# Patient Record
Sex: Female | Born: 1940 | Race: Black or African American | Hispanic: No | Smoking: Former smoker
Health system: Southern US, Community
[De-identification: ages and names within clinical notes are randomized; demographics above are authoritative.]

## PROBLEM LIST (undated history)

## (undated) DIAGNOSIS — K5909 Other constipation: Secondary | ICD-10-CM

## (undated) DIAGNOSIS — G629 Polyneuropathy, unspecified: Secondary | ICD-10-CM

## (undated) DIAGNOSIS — N760 Acute vaginitis: Secondary | ICD-10-CM

## (undated) DIAGNOSIS — Z9221 Personal history of antineoplastic chemotherapy: Secondary | ICD-10-CM

## (undated) DIAGNOSIS — C50919 Malignant neoplasm of unspecified site of unspecified female breast: Secondary | ICD-10-CM

## (undated) DIAGNOSIS — K635 Polyp of colon: Secondary | ICD-10-CM

## (undated) DIAGNOSIS — I10 Essential (primary) hypertension: Secondary | ICD-10-CM

## (undated) DIAGNOSIS — M810 Age-related osteoporosis without current pathological fracture: Secondary | ICD-10-CM

## (undated) DIAGNOSIS — G4700 Insomnia, unspecified: Secondary | ICD-10-CM

## (undated) DIAGNOSIS — S329XXA Fracture of unspecified parts of lumbosacral spine and pelvis, initial encounter for closed fracture: Secondary | ICD-10-CM

## (undated) DIAGNOSIS — E785 Hyperlipidemia, unspecified: Secondary | ICD-10-CM

## (undated) DIAGNOSIS — K760 Fatty (change of) liver, not elsewhere classified: Secondary | ICD-10-CM

## (undated) HISTORY — DX: Fracture of unspecified parts of lumbosacral spine and pelvis, initial encounter for closed fracture: S32.9XXA

## (undated) HISTORY — DX: Age-related osteoporosis without current pathological fracture: M81.0

## (undated) HISTORY — DX: Other constipation: K59.09

## (undated) HISTORY — PX: BREAST REDUCTION SURGERY: SHX8

## (undated) HISTORY — DX: Essential (primary) hypertension: I10

## (undated) HISTORY — DX: Insomnia, unspecified: G47.00

## (undated) HISTORY — DX: Hyperlipidemia, unspecified: E78.5

## (undated) HISTORY — DX: Acute vaginitis: N76.0

## (undated) HISTORY — DX: Personal history of antineoplastic chemotherapy: Z92.21

## (undated) HISTORY — DX: Malignant neoplasm of unspecified site of unspecified female breast: C50.919

## (undated) HISTORY — PX: OTHER SURGICAL HISTORY: SHX169

## (undated) HISTORY — DX: Fatty (change of) liver, not elsewhere classified: K76.0

## (undated) HISTORY — DX: Polyneuropathy, unspecified: G62.9

## (undated) HISTORY — DX: Polyp of colon: K63.5

---

## 1990-06-26 DIAGNOSIS — C50919 Malignant neoplasm of unspecified site of unspecified female breast: Secondary | ICD-10-CM

## 1990-06-26 HISTORY — DX: Malignant neoplasm of unspecified site of unspecified female breast: C50.919

## 1990-06-26 HISTORY — PX: MASTECTOMY: SHX3

## 1999-06-27 DIAGNOSIS — Z9221 Personal history of antineoplastic chemotherapy: Secondary | ICD-10-CM

## 1999-06-27 HISTORY — DX: Personal history of antineoplastic chemotherapy: Z92.21

## 1999-06-27 HISTORY — PX: MASTECTOMY: SHX3

## 2001-06-26 HISTORY — PX: BREAST RECONSTRUCTION: SHX9

## 2005-06-26 HISTORY — PX: BREAST SURGERY: SHX581

## 2010-06-26 DIAGNOSIS — K635 Polyp of colon: Secondary | ICD-10-CM

## 2010-06-26 DIAGNOSIS — M810 Age-related osteoporosis without current pathological fracture: Secondary | ICD-10-CM

## 2010-06-26 HISTORY — DX: Polyp of colon: K63.5

## 2010-06-26 HISTORY — DX: Age-related osteoporosis without current pathological fracture: M81.0

## 2010-06-26 HISTORY — PX: COLONOSCOPY: SHX174

## 2013-07-27 DIAGNOSIS — K5909 Other constipation: Secondary | ICD-10-CM

## 2013-07-27 HISTORY — PX: UPPER GI ENDOSCOPY: SHX6162

## 2013-07-27 HISTORY — DX: Other constipation: K59.09

## 2014-06-22 ENCOUNTER — Other Ambulatory Visit: Payer: Self-pay | Admitting: *Deleted

## 2014-06-22 ENCOUNTER — Ambulatory Visit (INDEPENDENT_AMBULATORY_CARE_PROVIDER_SITE_OTHER): Payer: Medicare Other | Admitting: Podiatry

## 2014-06-22 ENCOUNTER — Encounter: Payer: Self-pay | Admitting: Podiatry

## 2014-06-22 VITALS — BP 149/97 | HR 70 | Resp 16 | Ht 68.0 in | Wt 214.0 lb

## 2014-06-22 DIAGNOSIS — B351 Tinea unguium: Secondary | ICD-10-CM

## 2014-06-22 NOTE — Progress Notes (Signed)
   Subjective:    Patient ID: Alexis Arias, female    DOB: [birth year], 73 y.o.   MRN: [id number]  HPI Comments: Thick toenails, cant cut self due to neuropathy in my feet. i am pre diabetic , do not take any medications for it      Review of Systems  All other systems reviewed and are negative.      Objective:   Physical Exam: I have reviewed her past mental history medications allergy surgery social history and review of systems. Pulses are strongly palpable bilateral. Neurologic sensorium is intact with slight decreased to the toes per Semmes-Weinstein monofilament. Deep tendon reflexes are intact bilateral muscle strength is 5 over 5 dorsiflexion plantar flexors and inverters everters all intrinsic musculature is intact. Orthopedic evaluation to stretch joints distal to the ankle for range of motion without crepitation. 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 Cutaneous evaluation demonstrates supple well-hydrated cutis nails are thick yellow dystrophic onychomycotic and sharply incurvated.        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  Plan: Debridement of nails 1 through 5 bilateral. Follow up with her in 3 months

## 2014-06-26 HISTORY — PX: DILATION AND CURETTAGE OF UTERUS: SHX78

## 2014-08-24 ENCOUNTER — Ambulatory Visit: Payer: Medicare Other

## 2014-10-10 ENCOUNTER — Emergency Department
Admit: 2014-10-10 | Disposition: A | Discharge status: Home / Self Care | Payer: Self-pay | Admitting: Emergency Medicine

## 2014-10-10 LAB — COMPREHENSIVE METABOLIC PANEL
ALT: 27 U/L
ANION GAP: 5 — AB (ref 7–16)
AST: 32 U/L
Albumin: 3.9 g/dL
Alkaline Phosphatase: 91 U/L
BUN: 15 mg/dL
Bilirubin,Total: 0.5 mg/dL
CO2: 25 mmol/L
Calcium, Total: 10 mg/dL
Chloride: 107 mmol/L
Creatinine: 0.74 mg/dL
EGFR (African American): >60
EGFR (Non-African Amer.): >60
GLUCOSE: 106 mg/dL — AB
Potassium: 4 mmol/L
Sodium: 137 mmol/L
TOTAL PROTEIN: 8.6 g/dL — AB

## 2014-10-10 LAB — CBC WITH DIFFERENTIAL/PLATELET
BASOS PCT: 1.3 %
Basophil #: 0.1 10*3/uL (ref 0.0–0.1)
EOS PCT: 3.8 %
Eosinophil #: 0.2 10*3/uL (ref 0.0–0.7)
HCT: 38.9 % (ref 35.0–47.0)
HGB: 12.8 g/dL (ref 12.0–16.0)
Lymphocyte #: 2.4 10*3/uL (ref 1.0–3.6)
Lymphocyte %: 37.2 %
MCH: 30.1 pg (ref 26.0–34.0)
MCHC: 32.9 g/dL (ref 32.0–36.0)
MCV: 92 fL (ref 80–100)
MONO ABS: 0.8 x10 3/mm (ref 0.2–0.9)
Monocyte %: 12.1 %
Neutrophil #: 3 10*3/uL (ref 1.4–6.5)
Neutrophil %: 45.6 %
Platelet: 259 10*3/uL (ref 150–440)
RBC: 4.25 10*6/uL (ref 3.80–5.20)
RDW: 14 % (ref 11.5–14.5)
WBC: 6.5 10*3/uL (ref 3.6–11.0)

## 2014-10-25 DIAGNOSIS — E785 Hyperlipidemia, unspecified: Secondary | ICD-10-CM

## 2014-10-25 HISTORY — DX: Hyperlipidemia, unspecified: E78.5

## 2014-10-27 ENCOUNTER — Ambulatory Visit: Payer: Medicare Other

## 2014-11-02 ENCOUNTER — Ambulatory Visit (INDEPENDENT_AMBULATORY_CARE_PROVIDER_SITE_OTHER): Payer: Medicare Other | Admitting: Podiatry

## 2014-11-02 DIAGNOSIS — M79676 Pain in unspecified toe(s): Secondary | ICD-10-CM | POA: Diagnosis not present

## 2014-11-02 DIAGNOSIS — B351 Tinea unguium: Secondary | ICD-10-CM | POA: Diagnosis not present

## 2014-11-02 NOTE — Progress Notes (Signed)
° °  Subjective:    Patient ID: Alexis Arias, female    DOB: 08-23-1940, 74 y.o.   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 i am pre diabetic , do not take any medications for it      Review of Systems  All other systems reviewed and are negative.      Objective:   Physical Exam: I have reviewed her past mental history medications allergy surgery social history and review of systems. Pulses are strongly palpable bilateral. Neurologic sensorium is intact with slight decreased to the toes per Semmes-Weinstein monofilament. Deep tendon reflexes are intact bilateral muscle strength is 5 over 5 dorsiflexion plantar flexors and inverters everters all intrinsic musculature is intact. Orthopedic evaluation to stretch joints distal to the ankle for range of motion without crepitation. 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 Cutaneous evaluation demonstrates supple well-hydrated cutis nails are thick yellow dystrophic onychomycotic and sharply incurvated.        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  Plan: Debridement of nails 1 through 5 bilateral. Follow up with her in 3 months

## 2014-11-19 ENCOUNTER — Ambulatory Visit
Admission: RE | Admit: 2014-11-19 | Discharge: 2014-11-19 | Disposition: A | Discharge status: Home / Self Care | Payer: Medicare Other | Source: Ambulatory Visit | Attending: Obstetrics & Gynecology | Admitting: Obstetrics & Gynecology

## 2014-11-19 ENCOUNTER — Encounter
Admission: RE | Admit: 2014-11-19 | Discharge: 2014-11-19 | Disposition: A | Discharge status: Home / Self Care | Payer: Medicare Other | Source: Ambulatory Visit | Attending: Obstetrics & Gynecology | Admitting: Obstetrics & Gynecology

## 2014-11-19 DIAGNOSIS — Z01810 Encounter for preprocedural cardiovascular examination: Secondary | ICD-10-CM | POA: Insufficient documentation

## 2014-11-19 DIAGNOSIS — Z01812 Encounter for preprocedural laboratory examination: Secondary | ICD-10-CM | POA: Diagnosis not present

## 2014-11-19 DIAGNOSIS — N950 Postmenopausal bleeding: Secondary | ICD-10-CM | POA: Insufficient documentation

## 2014-11-19 DIAGNOSIS — I10 Essential (primary) hypertension: Secondary | ICD-10-CM

## 2014-11-19 DIAGNOSIS — Z01818 Encounter for other preprocedural examination: Secondary | ICD-10-CM | POA: Insufficient documentation

## 2014-11-19 LAB — CBC
HCT: 41.3 % (ref 35.0–47.0)
Hemoglobin: 13.7 g/dL (ref 12.0–16.0)
MCH: 30.4 pg (ref 26.0–34.0)
MCHC: 33.3 g/dL (ref 32.0–36.0)
MCV: 91.3 fL (ref 80.0–100.0)
Platelets: 251 10*3/uL (ref 150–440)
RBC: 4.52 MIL/uL (ref 3.80–5.20)
RDW: 14.1 % (ref 11.5–14.5)
WBC: 5 10*3/uL (ref 3.6–11.0)

## 2014-11-19 LAB — BASIC METABOLIC PANEL
Anion gap: 7 (ref 5–15)
BUN: 20 mg/dL (ref 6–20)
CALCIUM: 10.2 mg/dL (ref 8.9–10.3)
CHLORIDE: 104 mmol/L (ref 101–111)
CO2: 28 mmol/L (ref 22–32)
Creatinine, Ser: 0.88 mg/dL (ref 0.44–1.00)
Glucose, Bld: 87 mg/dL (ref 65–99)
POTASSIUM: 3.9 mmol/L (ref 3.5–5.1)
Sodium: 139 mmol/L (ref 135–145)

## 2014-11-19 LAB — DIFFERENTIAL
BASOS ABS: 0 10*3/uL (ref 0–0.1)
Basophils Relative: 1 %
Eosinophils Absolute: 0.1 10*3/uL (ref 0–0.7)
Eosinophils Relative: 2 %
Lymphocytes Relative: 52 %
Lymphs Abs: 2.6 10*3/uL (ref 1.0–3.6)
Monocytes Absolute: 0.5 10*3/uL (ref 0.2–0.9)
Monocytes Relative: 10 %
NEUTROS ABS: 1.8 10*3/uL (ref 1.4–6.5)
Neutrophils Relative %: 35 %

## 2014-11-19 NOTE — Patient Instructions (Signed)
  Your procedure is scheduled on: November 26, 2014 Report to Same Day Surgery To find out your arrival time please call [phone] between 1PM - 3PM on November 25, 2014.  Remember: Instructions that are not followed completely may result in serious medical risk, up to and including death, or upon the discretion of your surgeon and anesthesiologist your surgery may need to be rescheduled.    _x___ 1. Do not eat food or drink liquids after midnight. No gum chewing or hard candies.     _x___ 2. No Alcohol for 24 hours before or after surgery.   ____ 3. Bring all medications with you on the day of surgery if instructed.    _x__ 4. Notify your doctor if there is any change in your medical condition     (cold, fever, infections).     Do not wear jewelry, make-up, hairpins, clips or nail polish.  Do not wear lotions, powders, or perfumes. You may wear deodorant.  Do not shave 48 hours prior to surgery. Men may shave face and neck.  Do not bring valuables to the hospital.    [facility] is not responsible for any belongings or valuables.               Contacts, dentures or bridgework may not be worn into surgery.  Leave your suitcase in the car. After surgery it may be brought to your room.  For patients admitted to the hospital, discharge time is determined by your treatment team.   Patients discharged the day of surgery will not be allowed to drive home.   Please read over the following fact sheets that you were given:     ____ Take these medicines the morning of surgery with A SIP OF WATER: none    ____ Fleet Enema (as directed)   ____ Use CHG Soap as directed  ____ Use inhalers on the day of surgery  ____ Stop metformin 2 days prior to surgery    ____ Take 1/2 of usual insulin dose the night before surgery and none on the morning of surgery.   ____ Stop Coumadin/Plavix/aspirin on does not apply  _x___ Stop Anti-inflammatories on today. Tylenol ok for pain  ____ Stop supplements  until after surgery.    ____ Bring C-Pap to the hospital.

## 2014-11-20 NOTE — OR Nursing (Signed)
cxr faxed to Dr Leonides Schanz

## 2014-11-26 ENCOUNTER — Encounter
Admission: RE | Disposition: A | Discharge status: Home / Self Care | Payer: Self-pay | Source: Ambulatory Visit | Attending: Obstetrics & Gynecology

## 2014-11-26 ENCOUNTER — Encounter: Payer: Self-pay | Admitting: *Deleted

## 2014-11-26 ENCOUNTER — Ambulatory Visit: Payer: Medicare Other | Admitting: Anesthesiology

## 2014-11-26 ENCOUNTER — Ambulatory Visit
Admission: RE | Admit: 2014-11-26 | Discharge: 2014-11-26 | Disposition: A | Discharge status: Home / Self Care | Payer: Medicare Other | Source: Ambulatory Visit | Attending: Obstetrics & Gynecology | Admitting: Obstetrics & Gynecology

## 2014-11-26 DIAGNOSIS — N950 Postmenopausal bleeding: Secondary | ICD-10-CM | POA: Insufficient documentation

## 2014-11-26 DIAGNOSIS — E669 Obesity, unspecified: Secondary | ICD-10-CM | POA: Diagnosis not present

## 2014-11-26 DIAGNOSIS — Z87891 Personal history of nicotine dependence: Secondary | ICD-10-CM | POA: Diagnosis not present

## 2014-11-26 DIAGNOSIS — N840 Polyp of corpus uteri: Secondary | ICD-10-CM | POA: Diagnosis not present

## 2014-11-26 DIAGNOSIS — I10 Essential (primary) hypertension: Secondary | ICD-10-CM | POA: Diagnosis not present

## 2014-11-26 DIAGNOSIS — Z853 Personal history of malignant neoplasm of breast: Secondary | ICD-10-CM | POA: Diagnosis not present

## 2014-11-26 DIAGNOSIS — Z6832 Body mass index (BMI) 32.0-32.9, adult: Secondary | ICD-10-CM | POA: Insufficient documentation

## 2014-11-26 DIAGNOSIS — Z79810 Long term (current) use of selective estrogen receptor modulators (SERMs): Secondary | ICD-10-CM | POA: Insufficient documentation

## 2014-11-26 DIAGNOSIS — Z9013 Acquired absence of bilateral breasts and nipples: Secondary | ICD-10-CM | POA: Insufficient documentation

## 2014-11-26 HISTORY — PX: HYSTEROSCOPY WITH D & C: SHX1775

## 2014-11-26 SURGERY — DILATATION AND CURETTAGE /HYSTEROSCOPY
Anesthesia: General

## 2014-11-26 MED ORDER — DEXAMETHASONE SODIUM PHOSPHATE 4 MG/ML IJ SOLN
INTRAMUSCULAR | Status: DC | PRN
  Administered 2014-11-26: 5 mg via INTRAVENOUS

## 2014-11-26 MED ORDER — ONDANSETRON HCL 4 MG/2ML IJ SOLN
INTRAMUSCULAR | Status: DC | PRN
  Administered 2014-11-26: 4 mg via INTRAVENOUS

## 2014-11-26 MED ORDER — FENTANYL CITRATE (PF) 100 MCG/2ML IJ SOLN
INTRAMUSCULAR | Status: DC | PRN
  Administered 2014-11-26 (×2): 50 ug via INTRAVENOUS

## 2014-11-26 MED ORDER — FENTANYL CITRATE (PF) 100 MCG/2ML IJ SOLN
INTRAMUSCULAR | Status: AC
  Filled 2014-11-26: qty 2

## 2014-11-26 MED ORDER — FENTANYL CITRATE (PF) 100 MCG/2ML IJ SOLN
25.0000 ug | INTRAMUSCULAR | Status: DC | PRN
  Administered 2014-11-26 (×4): 25 ug via INTRAVENOUS

## 2014-11-26 MED ORDER — LACTATED RINGERS IV SOLN
INTRAVENOUS | Status: DC
  Administered 2014-11-26: 13:00:00 via INTRAVENOUS
  Administered 2014-11-26: 50 mL/h via INTRAVENOUS

## 2014-11-26 MED ORDER — LIDOCAINE HCL (CARDIAC) 20 MG/ML IV SOLN
INTRAVENOUS | Status: DC | PRN
  Administered 2014-11-26: 60 mg via INTRAVENOUS

## 2014-11-26 MED ORDER — ONDANSETRON HCL 4 MG/2ML IJ SOLN: 4.0000 mg | Freq: Once | INTRAMUSCULAR | Status: DC | PRN

## 2014-11-26 MED ORDER — FAMOTIDINE 20 MG PO TABS
20.0000 mg | ORAL_TABLET | Freq: Once | ORAL | Status: AC
  Administered 2014-11-26: 20 mg via ORAL

## 2014-11-26 MED ORDER — FAMOTIDINE 20 MG PO TABS
ORAL_TABLET | ORAL | Status: AC
  Filled 2014-11-26: qty 1

## 2014-11-26 MED ORDER — PROPOFOL 10 MG/ML IV BOLUS
INTRAVENOUS | Status: DC | PRN
  Administered 2014-11-26: 100 mg via INTRAVENOUS
  Administered 2014-11-26: 20 mg via INTRAVENOUS

## 2014-11-26 SURGICAL SUPPLY — 19 items
ABLATOR ENDOMETRIAL MYOSURE (ABLATOR) ×3 IMPLANT
CANISTER SUC SOCK COL 7IN (MISCELLANEOUS) ×3 IMPLANT
CATH ROBINSON RED A/P 16FR (CATHETERS) ×3 IMPLANT
GLOVE BIO SURGEON STRL SZ 6 (GLOVE) ×12 IMPLANT
GLOVE INDICATOR 6.5 STRL GRN (GLOVE) ×12 IMPLANT
GOWN STRL REUS W/ TWL LRG LVL3 (GOWN DISPOSABLE) ×2 IMPLANT
GOWN STRL REUS W/TWL LRG LVL3 (GOWN DISPOSABLE) ×4
KIT RM TURNOVER CYSTO AR (KITS) ×3 IMPLANT
MYOSURE LITE POLYP REMOVAL (MISCELLANEOUS) ×3 IMPLANT
PACK DNC HYST (MISCELLANEOUS) ×3 IMPLANT
PAD GROUND ADULT SPLIT (MISCELLANEOUS) ×3 IMPLANT
PAD OB MATERNITY 4.3X12.25 (PERSONAL CARE ITEMS) ×3 IMPLANT
PAD PREP 24X41 OB/GYN DISP (PERSONAL CARE ITEMS) ×3 IMPLANT
SEAL ROD LENS SCOPE MYOSURE (ABLATOR) ×3 IMPLANT
SOL .9 NS 3000ML IRR  AL (IV SOLUTION) ×2
SOL .9 NS 3000ML IRR UROMATIC (IV SOLUTION) ×1 IMPLANT
TUBING CONNECTING 10 (TUBING) ×2 IMPLANT
TUBING CONNECTING 10' (TUBING) ×1
TUBING HYSTEROSCOPY DOLPHIN (MISCELLANEOUS) ×3 IMPLANT

## 2014-11-26 NOTE — Anesthesia Procedure Notes (Signed)
Procedure Name: LMA Insertion Date/Time: 11/26/2014 1:23 PM Performed by: [facility] Pre-anesthesia Checklist: Patient identified, Emergency Drugs available, Suction available and Patient being monitored Patient Re-evaluated:Patient Re-evaluated prior to inductionOxygen Delivery Method: Circle system utilized Preoxygenation: Pre-oxygenation with 100% oxygen Intubation Type: IV induction Ventilation: Mask ventilation without difficulty LMA: LMA inserted LMA Size: 4.5

## 2014-11-26 NOTE — Op Note (Signed)
Operative Report Hysteroscopy, Dilation and Curettage, Polypectomy   Indications: 74yo female with history of breast cancer and tamoxifen use, with postmenopausal bleeding.  Pre-operative Diagnosis: post menopausal bleeding  Post-operative Diagnosis: endometrial polyp, post menopausal bleeding  Procedure: 1. Hysteroscopy 2. D&C 3. Polypectomy  Surgeon: Larey Days, MD  Assistant(s):  None  Anesthesia: general  Estimated Blood Loss:  Minimal         Intraoperative medications: none         Total IV Fluids: 790ml  Urine Output: none         Specimens:  1. Endometrial polyp 2. Endometrial curettings         Complications: none apparent         Disposition: PACU - hemodynamically stable.         Condition: stable  Findings: Uterus sounds to 6.5cm; retroverted, normal cervix, vagina, perineum.  Endometrial polyp on left wall, wide base.  Indication for procedure/Consents: 74 y.o.  here for scheduled surgery for the aforementioned diagnoses.  Risks of surgery were discussed with the patient including but not limited to: bleeding which may require transfusion; infection which may require antibiotics; injury to uterus or surrounding organs; intrauterine scarring; need for additional procedures including laparotomy or laparoscopy; and other postoperative/anesthesia complications. Written informed consent was obtained.    Procedure Details:   The patient was then taken to the operating room where anesthesia was administered and was found to be adequate.  After a formal and adequate timeout was performed, she was placed in the dorsal lithotomy position and examined with the above findings. She was then prepped and draped in the sterile manner.  A speculum was then placed in the patient's vagina and a single tooth tenaculum was applied to the posterior lip of the cervix.   The uterus was sounded to 6.5cm. Her cervix was serially dilated to accommodate the Myoscope, with findings  as above. The polyp was removed using the Myosure device.  A sharp curettage was then performed until there was a gritty texture in all four quadrants. The specimens were handed off to nursing.  The camera was reinserted and confirmed the uterus had been evacuated. The tenaculum and the vaginal speculum were removed after noting good hemostasis. The patient tolerated the procedure well and was taken to the recovery area awake, extubated and in stable condition.  The patient will be discharged to home as per PACU criteria.  Routine postoperative instructions given. She will follow up in the clinic in two to four weeks for postoperative evaluation.

## 2014-11-26 NOTE — Anesthesia Preprocedure Evaluation (Signed)
Anesthesia Evaluation  Patient identified by MRN, date of birth, ID band Patient awake    Reviewed: Allergy & Precautions, NPO status , Patient's Chart, lab work & pertinent test results  Airway Mallampati: II  TM Distance: >3 FB Neck ROM: Full    Dental  (+) Upper Dentures, Lower Dentures   Pulmonary Current Smoker,  breath sounds clear to auscultation  Pulmonary exam normal       Cardiovascular hypertension, Normal cardiovascular examRhythm:Regular Rate:Normal     Neuro/Psych negative neurological ROS  negative psych ROS   GI/Hepatic negative GI ROS, Neg liver ROS,   Endo/Other  negative endocrine ROS  Renal/GU negative Renal ROS  negative genitourinary   Musculoskeletal negative musculoskeletal ROS (+)   Abdominal (+) + obese,   Peds negative pediatric ROS (+)  Hematology negative hematology ROS (+)   Anesthesia Other Findings   Reproductive/Obstetrics                             Anesthesia Physical Anesthesia Plan  ASA: II  Anesthesia Plan: General   Post-op Pain Management:    Induction: Intravenous  Airway Management Planned: LMA  Additional Equipment:   Intra-op Plan:   Post-operative Plan: Extubation in OR  Informed Consent: I have reviewed the patients History and Physical, chart, labs and discussed the procedure including the risks, benefits and alternatives for the proposed anesthesia with the patient or authorized representative who has indicated his/her understanding and acceptance.   Dental advisory given  Plan Discussed with: CRNA and Surgeon  Anesthesia Plan Comments:         Anesthesia Quick Evaluation

## 2014-11-26 NOTE — Transfer of Care (Signed)
Immediate Anesthesia Transfer of Care Note  Patient: Alexis Arias  Procedure(s) Performed: Procedure(s): DILATATION AND CURETTAGE /HYSTEROSCOPY (N/A)  Patient Location: PACU  Anesthesia Type:General  Level of Consciousness: awake, alert  and oriented  Airway & Oxygen Therapy: Patient connected to face mask oxygen  Post-op Assessment: Report given to RN and Post -op Vital signs reviewed and stable  Post vital signs: Reviewed and stable  Last Vitals:  Filed Vitals:   11/26/14 1100  BP: 146/86  Pulse: 70  Temp: 36.9 C  Resp: 16    Complications: No apparent anesthesia complications

## 2014-11-26 NOTE — Progress Notes (Signed)
H&P Update  Pt was last seen last week and complete history and physical performed.  The surgical history has been reviewed and remains accurate without interval change. The patient was re-examined and patient's physiologic condition has not changed significantly in the last 30 days.  No new pharmacological allergies or types of therapy has been initiated.  Allergies  Allergen Reactions  . Penicillin G     Other reaction(s): Dizziness, passed out  . Lipitor [Atorvastatin] Rash    Memory loss also   @CMEDSIGONLY @ Past Medical History  Diagnosis Date  . Neuropathy   . Hypertension   . Cancer     bilater breast   Past Surgical History  Procedure Laterality Date  . Breast surgery    . Ovaries removed    . Breast reconstruction Bilateral     BP 146/86 mmHg  Pulse 70  Temp(Src) 98.5 F (36.9 C) (Oral)  Resp 16  Ht 5\' 7"  (1.702 m)  Wt 95.255 kg (210 lb)  BMI 32.88 kg/m2  NAD RRR no murmurs CTAB, no wheezing, resps unlabored +BS, soft, NTTP No c/c/e Pelvic exam deferred  The above history was confirmed with the patient. The condition still exists that makes this procedure necessary. Surgical plan includes hysteroscopy, dilation and curettage, as confirmed on the consent. The treatment plan remains the same, without new options for care.  The patient understands the potential benefits and risks and the consents have been signed and placed on the chart.     Larey Days, MD Attending Obstetrician and Gynecologist [facility]

## 2014-11-26 NOTE — Anesthesia Postprocedure Evaluation (Signed)
  Anesthesia Post-op Note  Patient: Alexis Arias  Procedure(s) Performed: Procedure(s): DILATATION AND CURETTAGE /HYSTEROSCOPY (N/A)  Anesthesia type:General  Patient location: PACU  Post pain: Pain level controlled  Post assessment: Post-op Vital signs reviewed, Patient's Cardiovascular Status Stable, Respiratory Function Stable, Patent Airway and No signs of Nausea or vomiting  Post vital signs: Reviewed and stable  Last Vitals:  Filed Vitals:   11/26/14 1415  BP: 162/97  Pulse: 81  Temp: 36.1 C  Resp:     Level of consciousness: awake, alert  and patient cooperative  Complications: No apparent anesthesia complications

## 2014-11-26 NOTE — Discharge Instructions (Signed)
You will experience some bleeding over the next several days.  Do not be alarmed, unless the bleeding gets heavier.  In this case, please call the office.  Some cramping is also normal.  Take pain relief medications that you tolerate as needed.  Please call for temperature >100.4 that persists, any severe pain, or other concerns you may have.  Hope you have a seemless recovery!    AMBULATORY SURGERY  DISCHARGE INSTRUCTIONS   1) The drugs that you were given will stay in your system until tomorrow so for the next 24 hours you should not:  A) Drive an automobile B) Make any legal decisions C) Drink any alcoholic beverage   2) You may resume regular meals tomorrow.  Today it is better to start with liquids and gradually work up to solid foods.  You may eat anything you prefer, but it is better to start with liquids, then soup and crackers, and gradually work up to solid foods.   3) Please notify your doctor immediately if you have any unusual bleeding, trouble breathing, redness and pain at the surgery site, drainage, fever, or pain not relieved by medicatio           4) Additional Instructions:

## 2014-11-27 ENCOUNTER — Encounter: Payer: Self-pay | Admitting: Obstetrics & Gynecology

## 2014-11-27 LAB — SURGICAL PATHOLOGY

## 2014-12-10 ENCOUNTER — Ambulatory Visit: Payer: Medicare Other | Admitting: Podiatry

## 2014-12-14 ENCOUNTER — Ambulatory Visit (INDEPENDENT_AMBULATORY_CARE_PROVIDER_SITE_OTHER): Payer: Medicare Other

## 2014-12-14 ENCOUNTER — Encounter: Payer: Self-pay | Admitting: Podiatry

## 2014-12-14 ENCOUNTER — Ambulatory Visit (INDEPENDENT_AMBULATORY_CARE_PROVIDER_SITE_OTHER): Payer: Medicare Other | Admitting: Podiatry

## 2014-12-14 VITALS — BP 139/92 | HR 71 | Resp 17 | Ht 68.0 in | Wt 210.0 lb

## 2014-12-14 DIAGNOSIS — Q6650 Congenital pes planus, unspecified foot: Secondary | ICD-10-CM | POA: Diagnosis not present

## 2014-12-14 DIAGNOSIS — M79672 Pain in left foot: Secondary | ICD-10-CM | POA: Diagnosis not present

## 2014-12-14 DIAGNOSIS — M7750 Other enthesopathy of unspecified foot: Secondary | ICD-10-CM

## 2014-12-14 DIAGNOSIS — M2010 Hallux valgus (acquired), unspecified foot: Secondary | ICD-10-CM | POA: Diagnosis not present

## 2014-12-14 DIAGNOSIS — M778 Other enthesopathies, not elsewhere classified: Secondary | ICD-10-CM

## 2014-12-14 DIAGNOSIS — M779 Enthesopathy, unspecified: Secondary | ICD-10-CM

## 2014-12-14 NOTE — Progress Notes (Signed)
   Subjective:    Patient ID: Alexis Arias, female    DOB: 1940/12/22, 74 y.o.   MRN: [id number] Pt is concerned with her bunions medial side for about 1 year. She has been seen by a physician , she is in the process of moving , transferring care here, Alexis Arias . She has had xrays but it has been at least 1 year .    HPI    Review of Systems     Objective:   Physical Exam: I have reviewed her past medical history medications allergies surgery social history and review of systems. Pulses are strongly palpable bilateral. Neurologic sensorium is intact per Semmes-Weinstein monofilament. Deep tendon reflexes are intact bilateral and muscle strength +5 over 5 dorsiflexors plantar flexors inverters and everters all intrinsic musculature is intact. Orthopedic evaluation demonstrates all joints system to the ankle for range of motion without crepitation. Cutaneous evaluation demonstrates supple well-hydrated cutis. Hallux abductovalgus deformity is present bilateral with hallux limitus bilaterally.        Assessment & Plan:  Assessment: Capsulitis first metatarsophalangeal joint right greater than left.  Plan: Injected the first metatarsophalangeal joint today with dexamethasone and local anesthesia after sterile Betadine skin prep. Follow up with her as needed.

## 2015-02-01 ENCOUNTER — Ambulatory Visit (INDEPENDENT_AMBULATORY_CARE_PROVIDER_SITE_OTHER): Payer: Medicare Other | Admitting: Podiatry

## 2015-02-01 DIAGNOSIS — M79676 Pain in unspecified toe(s): Secondary | ICD-10-CM | POA: Diagnosis not present

## 2015-02-01 DIAGNOSIS — B351 Tinea unguium: Secondary | ICD-10-CM | POA: Diagnosis not present

## 2015-02-01 NOTE — Progress Notes (Signed)
   Subjective:    Patient ID: Bobbye Morton, female    DOB: 1941-05-22, 74 y.o.   MRN: [id number]  HPI Comments: Thick toenails, cant cut self due to neuropathy in my feet. i am pre diabetic , do not take any medications for it      Review of Systems  All other systems reviewed and are negative.      Objective:   Physical Exam: I have reviewed her past mental history medications allergy surgery social history and review of systems. Pulses are strongly palpable bilateral. Neurologic sensorium is intact with slight decreased to the toes per Semmes-Weinstein monofilament. Deep tendon reflexes are intact bilateral muscle strength is 5 over 5 dorsiflexion plantar flexors and inverters everters all intrinsic musculature is intact. Orthopedic evaluation to stretch joints distal to the ankle for range of motion without crepitation. 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 Cutaneous evaluation demonstrates supple well-hydrated cutis nails are thick yellow dystrophic onychomycotic and sharply incurvated.        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  Plan: Debridement of nails 1 through 5 bilateral. Follow up with her in 3 months. 3 mo.

## 2015-04-08 DIAGNOSIS — I10 Essential (primary) hypertension: Secondary | ICD-10-CM | POA: Insufficient documentation

## 2015-04-08 DIAGNOSIS — C50919 Malignant neoplasm of unspecified site of unspecified female breast: Secondary | ICD-10-CM | POA: Insufficient documentation

## 2015-04-08 DIAGNOSIS — M810 Age-related osteoporosis without current pathological fracture: Secondary | ICD-10-CM | POA: Insufficient documentation

## 2015-04-08 DIAGNOSIS — G4700 Insomnia, unspecified: Secondary | ICD-10-CM | POA: Insufficient documentation

## 2015-04-26 ENCOUNTER — Telehealth: Payer: Self-pay | Admitting: *Deleted

## 2015-04-26 NOTE — Telephone Encounter (Signed)
Has appt 11/3 adn wants to discuss Dexa scan done at [facility]   DXA bone density (04/15/2015 10:46 AM) DXA bone density (04/15/2015 10:46 AM)  Narrative  [place] Report       REFERRING OH:YWVPX   TECHNICIAN:Lori ARobinson  HISTORY:BASELINE STUDY. 74 YEAR OLD BLACK FEMALE.    SITE DATE BMD g/cm2  T-SCORE Z-SCORE g/cm2  CHANGE % CHANGESTATISTICALLY SIGNIFICANT?   LUMBAR  SPINE L1- L4 04/15/15 0.793 -2.3       HIP L.FEM.NECK 04/16/15 0.847 0.0         L.TOTAL HIP 04/15/15 0.896 -0.4       FOREARM    L/R 33%      INTERPRETATION.: Osteopenia/Low bone mass .      FRACTURE RISK ASSESSMENT (FRAX)  __0.3___10 year risk of hip fracture._3.2____10 year risk of any   major fracture.     TREATMENT:    The National Osteoporosis Foundation (2008) recommends treatment for:  Patients with hip or vertebral fracture (clinical or morphometric)  Patients with osteoporosis as defined by T score < = -2.5  Postmenopausal women or men age 40 and older with low bone mass (T score   -1.0 to -2.5, osteopenia) at the femoral neck, total hip, or spine and 10   year hip fracture risk probability >3% or a 10 year all major osteoporosis   related fracture probability of >20%.  BMD should be monitored two years after initiating therapy and at two-year   intervals thereafter.    AMarland Kitchen MELISSA   Certified Clinical Densitometrist    The [place] is a Hologic QDR 4500.  [facility] [place] facility and technologist [employer] [facility]):  TechnologistSite LS Spine Site Hip  S G 0.031 g/cm20.040 g/cm2  L R0.054 g/cm2 0.033 g/cm2  at 95% confidence level

## 2015-04-28 ENCOUNTER — Encounter: Payer: Self-pay | Admitting: *Deleted

## 2015-04-29 ENCOUNTER — Inpatient Hospital Stay: Payer: Medicare Other | Attending: Internal Medicine | Admitting: Internal Medicine

## 2015-04-29 VITALS — BP 149/88 | HR 80 | Temp 97.2°F | Ht 66.0 in | Wt 208.6 lb

## 2015-04-29 DIAGNOSIS — E785 Hyperlipidemia, unspecified: Secondary | ICD-10-CM | POA: Insufficient documentation

## 2015-04-29 DIAGNOSIS — Z170 Estrogen receptor positive status [ER+]: Secondary | ICD-10-CM

## 2015-04-29 DIAGNOSIS — M8580 Other specified disorders of bone density and structure, unspecified site: Secondary | ICD-10-CM | POA: Insufficient documentation

## 2015-04-29 DIAGNOSIS — C50111 Malignant neoplasm of central portion of right female breast: Secondary | ICD-10-CM

## 2015-04-29 DIAGNOSIS — R634 Abnormal weight loss: Secondary | ICD-10-CM | POA: Insufficient documentation

## 2015-04-29 DIAGNOSIS — Z853 Personal history of malignant neoplasm of breast: Secondary | ICD-10-CM | POA: Insufficient documentation

## 2015-04-29 DIAGNOSIS — Z79899 Other long term (current) drug therapy: Secondary | ICD-10-CM | POA: Insufficient documentation

## 2015-04-29 DIAGNOSIS — M47816 Spondylosis without myelopathy or radiculopathy, lumbar region: Secondary | ICD-10-CM | POA: Insufficient documentation

## 2015-04-29 DIAGNOSIS — I10 Essential (primary) hypertension: Secondary | ICD-10-CM | POA: Diagnosis not present

## 2015-04-29 DIAGNOSIS — F17200 Nicotine dependence, unspecified, uncomplicated: Secondary | ICD-10-CM | POA: Diagnosis not present

## 2015-04-29 DIAGNOSIS — E782 Mixed hyperlipidemia: Secondary | ICD-10-CM | POA: Insufficient documentation

## 2015-04-29 DIAGNOSIS — M549 Dorsalgia, unspecified: Secondary | ICD-10-CM | POA: Diagnosis not present

## 2015-04-29 DIAGNOSIS — C50919 Malignant neoplasm of unspecified site of unspecified female breast: Secondary | ICD-10-CM | POA: Insufficient documentation

## 2015-04-29 NOTE — Progress Notes (Signed)
Pt ambulates w/o assistance, alert x 3.  Pt c/o of stomach pain 7 oof 10.  Dr. notified

## 2015-04-29 NOTE — Progress Notes (Signed)
[facility] NOTE  Patient Care Team: Glendon Axe, MD as PCP - General (Internal Medicine)  CHIEF COMPLAINTS/PURPOSE OF CONSULTATION:   # 1992- LEFT BREAST CANCER s/p Mastec; tamoxifen  # 2001- RIGHT BREAST CANCER s/p Mastec; chemo; Tamoxifen [Dr.Kasari;Oncology,[place]]- No records  # OCT 2016- BMD- Osteopenia on Reclast q 52m; D&C June 2016- Neg for malignancy; Bil. Oopherectomy; ? Genetic testing   HISTORY OF PRESENTING ILLNESS:  Alexis Arias 74 y.o.  female above history of bilateral breast cancer; most recent one in 2001. Patient has recently moved from [place] back to [place]. Patient has been referred was establish care with oncology.  Patient complains of chronic back pain which has gotten worse in the last many months. She has gotten injection to the back in the past. She also complains of weight loss of about 10-15 pounds in the last many months. She also has this vague abdominal discomfort going on for the last many months. Given the issues with back pain she is some difficulty with walking also.  Denies any unusual shortness of breath or cough. Denies any chest pain or nausea vomiting. She complains of chronic discomfort at the site of her implants /reconstruction. An MRI of the reconstructed breast shows no evidence of any residual breast tissue /implants in place.    ROS: A complete 10 point review of system is done which is negative except mentioned above in history of present illness. MEDICAL HISTORY:  Past Medical History  Diagnosis Date  . Neuropathy ([place])   . Hypertension   . Breast cancer ([place]) 2001    right breast-followed by modified radical mastectomy and chemotherapy  . Breast cancer ([place]) 1992    left-post mastectomy and "hormonal manipulation"  . History of chemotherapy 2001  . Hyperlipidemia 10/2014  . Osteoporosis 2012    tx with reclast  . Constipation, chronic 07/2013  . Insomnia   . Hepatic steatosis   . Acute  vaginitis     SURGICAL HISTORY: Past Surgical History  Procedure Laterality Date  . Breast surgery    . Ovaries removed    . Breast reconstruction Bilateral   . Hysteroscopy w/d&c N/A 11/26/2014    Procedure: DILATATION AND CURETTAGE /HYSTEROSCOPY;  Surgeon: Honor Loh Ward, MD;  Location: [facility] ORS;  Service: Gynecology;  Laterality: N/A;  . Colonoscopy  2012  . Upper gi endoscopy  07/2013  . Dilation and curettage of uterus  2016    SOCIAL HISTORY: Social History   Social History  . Marital Status: Single    Spouse Name: N/A  . Number of Children: N/A  . Years of Education: N/A   Occupational History  . Not on file.   Social History Main Topics  . Smoking status: Current Some Day Smoker    Types: Cigarettes  . Smokeless tobacco: Never Used     Comment: 1 cigarette a day  . Alcohol Use: No  . Drug Use: No  . Sexual Activity: Yes    Birth Control/ Protection: Post-menopausal   Other Topics Concern  . Not on file   Social History Narrative    FAMILY HISTORY: Family History  Problem Relation Age of Onset  . Heart disease Mother   . Alzheimer's disease Father     ALLERGIES:  is allergic to penicillin g and lipitor.  MEDICATIONS:  Current Outpatient Prescriptions  Medication Sig Dispense Refill  . losartan-hydrochlorothiazide (HYZAAR) 100-12.5 MG tablet Take 100 tablets by mouth daily.    . QUEtiapine (  SEROQUEL) 50 MG tablet Take 50 tablets by mouth daily.    Marland Kitchen losartan-hydrochlorothiazide (HYZAAR) 100-12.5 MG per tablet Take 1 tablet by mouth every morning.     . metroNIDAZOLE (METROGEL) 0.75 % vaginal gel Place 7.41 Applicatorfuls vaginally as needed.    Marland Kitchen QUEtiapine (SEROQUEL) 50 MG tablet Take 50 mg by mouth at bedtime.     Marland Kitchen terconazole (TERAZOL 7) 0.4 % vaginal cream Place vaginally.     No current facility-administered medications for this visit.      Marland Kitchen  PHYSICAL EXAMINATION: ECOG PERFORMANCE STATUS: 0 - Asymptomatic  Filed Vitals:   04/29/15  1122  BP: 149/88  Pulse: 80  Temp: 97.2 F (36.2 C)   Filed Weights   04/29/15 1122  Weight: 208 lb 8.9 oz (94.6 kg)    GENERAL: Well-nourished well-developed; Alert, no distress and comfortable. She is alone. EYES: no pallor or icterus OROPHARYNX: no thrush or ulceration; good dentition  NECK: supple, no masses felt LYMPH:  no palpable lymphadenopathy in the cervical, axillary or inguinal regions LUNGS: clear to auscultation and  No wheeze or crackles HEART/CVS: regular rate & rhythm and no murmurs; No lower extremity edema ABDOMEN: abdomen soft, non-tender and normal bowel sounds Musculoskeletal:no cyanosis of digits and no clubbing  PSYCH: alert & oriented x 3 with fluent speech NEURO: no focal motor/sensory deficits SKIN:  no rashes or significant lesions Bilateral chest wall exam [in presence of chaperone]- bilateral reconstructed breasts noted; no lumps or bumps noted. Bilateral well healing scars noted.  LABORATORY DATA:  I have reviewed the data as listed Lab Results  Component Value Date   WBC 5.0 11/19/2014   HGB 13.7 11/19/2014   HCT 41.3 11/19/2014   MCV 91.3 11/19/2014   PLT 251 11/19/2014    Recent Labs  10/10/14 1524 11/19/14 1039  NA 137 139  K 4.0 3.9  CL 107 104  CO2 25 28  GLUCOSE 106* 87  BUN 15 20  CREATININE 0.74 0.88  CALCIUM 10.0 10.2  GFRNONAA >60 >60  GFRAA >60 >60  PROT 8.6*  --   ALBUMIN 3.9  --   AST 32  --   ALT 27  --   ALKPHOS 91  --   BILITOT 0.5  --     RADIOGRAPHIC STUDIES: I have personally reviewed the radiological images as listed and agreed with the findings in the report. No results found.  ASSESSMENT & PLAN:   # Bilateral breast cancer- metachronous [phone] and 2001]; status post mastectomy. As per patient appears to be ER/PR positive. No records available. Given her symptoms of weight loss/back pain/ abdominal discomfort- recommend reevaluation with CT chest abdomen pelvis and also a bone scan. Patient follow-up  with me in approximately 2-3 weeks with scans prior.  # Osteopenia on Reclast.   Thank you Dr.Singh for allowing me to participate in the care of your pleasant patient. Please do not hesitate to contact me with questions or concerns in the interim.  All questions were answered. The patient knows to call the clinic with any problems, questions or concerns.  I spent 25 minutes counseling the patient face to face. The total time spent in the appointment was 45 minutes and more than 50% was on counseling.     Cammie Sickle, MD 04/29/2015 11:54 AM

## 2015-04-30 DIAGNOSIS — R42 Dizziness and giddiness: Secondary | ICD-10-CM | POA: Insufficient documentation

## 2015-05-05 ENCOUNTER — Encounter (INDEPENDENT_AMBULATORY_CARE_PROVIDER_SITE_OTHER): Payer: Medicare Other | Admitting: Podiatry

## 2015-05-05 ENCOUNTER — Encounter: Payer: Self-pay | Admitting: Podiatry

## 2015-05-05 ENCOUNTER — Ambulatory Visit: Payer: Medicare Other

## 2015-05-05 DIAGNOSIS — B351 Tinea unguium: Secondary | ICD-10-CM

## 2015-05-05 DIAGNOSIS — M79676 Pain in unspecified toe(s): Secondary | ICD-10-CM

## 2015-05-12 ENCOUNTER — Ambulatory Visit
Admission: RE | Admit: 2015-05-12 | Discharge: 2015-05-12 | Disposition: A | Discharge status: Home / Self Care | Payer: Medicare Other | Source: Ambulatory Visit | Attending: Internal Medicine | Admitting: Internal Medicine

## 2015-05-12 DIAGNOSIS — M5136 Other intervertebral disc degeneration, lumbar region: Secondary | ICD-10-CM | POA: Diagnosis not present

## 2015-05-12 DIAGNOSIS — C50111 Malignant neoplasm of central portion of right female breast: Secondary | ICD-10-CM

## 2015-05-12 DIAGNOSIS — Z9013 Acquired absence of bilateral breasts and nipples: Secondary | ICD-10-CM | POA: Insufficient documentation

## 2015-05-12 DIAGNOSIS — Z08 Encounter for follow-up examination after completed treatment for malignant neoplasm: Secondary | ICD-10-CM | POA: Diagnosis present

## 2015-05-12 DIAGNOSIS — Z853 Personal history of malignant neoplasm of breast: Secondary | ICD-10-CM | POA: Diagnosis present

## 2015-05-12 DIAGNOSIS — M47896 Other spondylosis, lumbar region: Secondary | ICD-10-CM | POA: Insufficient documentation

## 2015-05-12 DIAGNOSIS — R634 Abnormal weight loss: Secondary | ICD-10-CM | POA: Insufficient documentation

## 2015-05-12 DIAGNOSIS — J8410 Pulmonary fibrosis, unspecified: Secondary | ICD-10-CM | POA: Insufficient documentation

## 2015-05-12 DIAGNOSIS — K5641 Fecal impaction: Secondary | ICD-10-CM | POA: Diagnosis not present

## 2015-05-12 MED ORDER — IOHEXOL 350 MG/ML SOLN
100.0000 mL | Freq: Once | INTRAVENOUS | Status: AC | PRN
  Administered 2015-05-12: 100 mL via INTRAVENOUS

## 2015-05-12 MED ORDER — TECHNETIUM TC 99M MEDRONATE IV KIT
22.2700 | PACK | Freq: Once | INTRAVENOUS | Status: AC | PRN
  Administered 2015-05-12: 22.27 via INTRAVENOUS

## 2015-05-13 ENCOUNTER — Ambulatory Visit: Payer: Medicare Other | Admitting: Internal Medicine

## 2015-05-14 ENCOUNTER — Inpatient Hospital Stay (HOSPITAL_BASED_OUTPATIENT_CLINIC_OR_DEPARTMENT_OTHER): Payer: Medicare Other | Admitting: Internal Medicine

## 2015-05-14 VITALS — BP 168/97 | HR 78 | Temp 98.5°F | Resp 18 | Ht 66.0 in | Wt 215.4 lb

## 2015-05-14 DIAGNOSIS — C50111 Malignant neoplasm of central portion of right female breast: Secondary | ICD-10-CM

## 2015-05-14 DIAGNOSIS — M8580 Other specified disorders of bone density and structure, unspecified site: Secondary | ICD-10-CM

## 2015-05-14 DIAGNOSIS — Z170 Estrogen receptor positive status [ER+]: Secondary | ICD-10-CM | POA: Diagnosis not present

## 2015-05-14 DIAGNOSIS — M549 Dorsalgia, unspecified: Secondary | ICD-10-CM | POA: Diagnosis not present

## 2015-05-14 DIAGNOSIS — Z853 Personal history of malignant neoplasm of breast: Secondary | ICD-10-CM

## 2015-05-14 DIAGNOSIS — R634 Abnormal weight loss: Secondary | ICD-10-CM

## 2015-05-14 DIAGNOSIS — Z79899 Other long term (current) drug therapy: Secondary | ICD-10-CM

## 2015-05-14 NOTE — Progress Notes (Signed)
[facility] NOTE  Patient Care Team: Glendon Axe, MD as PCP - General (Internal Medicine)  CHIEF COMPLAINTS/PURPOSE OF CONSULTATION:   # 1992- LEFT BREAST CANCER s/p Mastec; tamoxifen; # 2001- RIGHT BREAST CANCER s/p Mastec; chemo; Tamoxifen [Dr.Kasari;Oncology,[place]; [place]]; NOV 2016- CT-C/A/P- NED  # OCT 2016- BMD- Osteopenia on Reclast q 60m; D&C June 2016- Neg for malignancy; Bil. Oopherectomy;   # Chronic LOW back pain/arthiris-    HISTORY OF PRESENTING ILLNESS:  Alexis Arias 74 y.o.  female above history of bilateral breast cancer; most recent one in 2001; given worsening back pain/weight loss- CT scan bone scan were ordered. The patient is here for follow-up.  In the interim she notes have an abnormal EKG-for which she is awaiting to have a 2-D echo.  Patient continues to complain of chronic worsening back pain; patient has had steroid injections in the past.   ROS:No new shortness of breath or cough. No new lumps or bumps.  A complete 10 point review of system is done which is negative except mentioned above in history of present illness. MEDICAL HISTORY:  Past Medical History  Diagnosis Date  . Neuropathy ([place])   . Hypertension   . Breast cancer ([place]) 2001    right breast-followed by modified radical mastectomy and chemotherapy  . Breast cancer ([place]) 1992    left-post mastectomy and "hormonal manipulation"  . History of chemotherapy 2001  . Hyperlipidemia 10/2014  . Osteoporosis 2012    tx with reclast  . Constipation, chronic 07/2013  . Insomnia   . Hepatic steatosis   . Acute vaginitis     SURGICAL HISTORY: Past Surgical History  Procedure Laterality Date  . Breast surgery    . Ovaries removed    . Breast reconstruction Bilateral   . Hysteroscopy w/d&c N/A 11/26/2014    Procedure: DILATATION AND CURETTAGE /HYSTEROSCOPY;  Surgeon: Honor Loh Ward, MD;  Location: [facility] ORS;  Service: Gynecology;  Laterality: N/A;  . Colonoscopy  2012  .  Upper gi endoscopy  07/2013  . Dilation and curettage of uterus  2016    SOCIAL HISTORY: Social History   Social History  . Marital Status: Single    Spouse Name: N/A  . Number of Children: N/A  . Years of Education: N/A   Occupational History  . Not on file.   Social History Main Topics  . Smoking status: Current Some Day Smoker    Types: Cigarettes  . Smokeless tobacco: Never Used     Comment: 1 cigarette a day  . Alcohol Use: No  . Drug Use: No  . Sexual Activity: Yes    Birth Control/ Protection: Post-menopausal   Other Topics Concern  . Not on file   Social History Narrative    FAMILY HISTORY: Family History  Problem Relation Age of Onset  . Heart disease Mother   . Alzheimer's disease Father     ALLERGIES:  is allergic to penicillin g and lipitor.  MEDICATIONS:  Current Outpatient Prescriptions  Medication Sig Dispense Refill  . losartan-hydrochlorothiazide (HYZAAR) 100-12.5 MG per tablet Take 1 tablet by mouth every morning.     Marland Kitchen losartan-hydrochlorothiazide (HYZAAR) 100-12.5 MG tablet Take 100 tablets by mouth daily.    . metroNIDAZOLE (METROGEL) 0.75 % vaginal gel Place [id number] Applicatorfuls vaginally as needed.    Marland Kitchen QUEtiapine (SEROQUEL) 50 MG tablet Take 50 mg by mouth at bedtime.     Marland Kitchen QUEtiapine (SEROQUEL) 50 MG tablet Take 50 tablets by mouth daily.    Marland Kitchen  terconazole (TERAZOL 7) 0.4 % vaginal cream Place vaginally.     No current facility-administered medications for this visit.      Marland Kitchen  PHYSICAL EXAMINATION: ECOG PERFORMANCE STATUS: 0 - Asymptomatic  Filed Vitals:   05/14/15 1112  BP: 168/97  Pulse: 78  Temp: 98.5 F (36.9 C)  Resp: 18   Filed Weights   05/14/15 1112  Weight: 215 lb 6.2 oz (97.7 kg)    GENERAL: Well-nourished well-developed; Alert, no distress and comfortable. She is alone.   LABORATORY DATA:  I have reviewed the data as listed Lab Results  Component Value Date   WBC 5.0 11/19/2014   HGB 13.7 11/19/2014    HCT 41.3 11/19/2014   MCV 91.3 11/19/2014   PLT 251 11/19/2014    Recent Labs  10/10/14 1524 11/19/14 1039  NA 137 139  K 4.0 3.9  CL 107 104  CO2 25 28  GLUCOSE 106* 87  BUN 15 20  CREATININE 0.74 0.88  CALCIUM 10.0 10.2  GFRNONAA >60 >60  GFRAA >60 >60  PROT 8.6*  --   ALBUMIN 3.9  --   AST 32  --   ALT 27  --   ALKPHOS 91  --   BILITOT 0.5  --     RADIOGRAPHIC STUDIES: I have personally reviewed the radiological images as listed and agreed with the findings in the report. Ct Chest W Contrast  05/12/2015  CLINICAL DATA:  Bilateral cancer. Chronic back pain. Abdominal discomfort. Recent move from [place] to [place]. EXAM: CT CHEST, ABDOMEN, AND PELVIS WITH CONTRAST TECHNIQUE: Multidetector CT imaging of the chest, abdomen and pelvis was performed following the standard protocol during bolus administration of intravenous contrast. CONTRAST:  165mL OMNIPAQUE IOHEXOL 350 MG/ML SOLN COMPARISON:  Chest radiograph from 11/19/2014 FINDINGS: CT CHEST FINDINGS Mediastinum/Nodes: 9 mm right lower paratracheal lymph node, image 91 series 7. No significant atherosclerosis.  The esophagus unremarkable. Lungs/Pleura: Interstitial accentuation anteriorly in both upper lobes, possibly partially related to radiation therapy. However, there is also some lower lobe interstitial accentuation suggesting mild peripheral fibrosis. No overt honeycombing although there is some cylindrical bronchiectasis in both lower lobes. Dependent subsegmental atelectasis in both lower lobes with very faint nodularity but no solid nodule observed. Musculoskeletal: Bilateral breast implants.  Thoracic spondylosis. CT ABDOMEN PELVIS FINDINGS Hepatobiliary: Unremarkable Pancreas: Unremarkable Spleen: Unremarkable Adrenals/Urinary Tract: Peripheral hypodense 7 mm lesion of the right mid kidney posteriorly on image 17 series 4, likely cysts but technically nonspecific due to small size. Mild cortical thinning in parts  of the left kidney upper pole compatible mild scarring. Stomach/Bowel: Prominent stool throughout the colon favors constipation. Vascular/Lymphatic: Unremarkable Reproductive: Calcifications the left of the uterus are likely vascular. Possible posterior uterine fibroid. Other: No supplemental non-categorized findings. Musculoskeletal: Lower lumbar facet arthropathy especially on the right with grade 1 degenerative anterolisthesis at L4-5. Right foraminal impingement at L3-4, L4-5, and L5-S1; and left foraminal impingement at L4-5 and L5-S1. IMPRESSION: 1. No findings of active malignancy in the chest, abdomen, or pelvis. 2. Peripheral fibrosis in the lungs. 3.  Prominent stool throughout the colon favors constipation. 4. Possible posterior uterine fibroid. 5. Lower lumbar spondylosis and degenerative disc disease causing multilevel foraminal impingement. Electronically Signed   By: Van Clines M.D.   On: 05/12/2015 15:35   Nm Bone Scan Whole Body  05/12/2015  CLINICAL DATA:  History of breast malignancy with bilateral mastectomy. Unexplained weight loss, intermittent pelvic pain on the left over the past 2 months,  chronic low back pain with known degenerative disc disease. EXAM: NUCLEAR MEDICINE WHOLE BODY BONE SCAN TECHNIQUE: Whole body anterior and posterior images were obtained approximately 3 hours after intravenous injection of radiopharmaceutical. RADIOPHARMACEUTICALS:  22.27 mCi Technetium-37m MDP IV COMPARISON:  CT scan of the chest, abdomen, and pelvis of today's date FINDINGS: There is adequate uptake of the radiopharmaceutical by the skeleton. There is adequate soft tissue clearance and renal activity. Activity within the calvarium is normal. Activity within the left maxilla likely reflects periodontal disease. Increased uptake in the anterior aspect of the left first rib is consistent with degenerative change on today's CT scan. Mild degenerative changes of the sternoclavicular joints  bilaterally are present. Increased uptake within the shoulders and left wrist are consistent with degenerative change. There is low-level increased uptake in the upper mid and lower thoracic spine at multiple vertebral levels. This corresponds to degenerative disc disease on today's CT scan. There is mildly increased uptake in the posterior elements of the mid and lower lumbar spine on the right compatible with degenerative change based on the appearance of the lumbar spinal today's CT scan. Within the pelvis there is a tiny focus of increased uptake overlying the lower aspect of the left SI joint. No lytic or blastic bony lesion here is observed. There is increased uptake associated with the knees and ankles and is most compatible with degenerative change. Minimal increased uptake associated with the greater trochanter on the left is consistent with degenerative change based on the CT appearance today. IMPRESSION: There are no findings suspicious for skeletal metasases. Significant degenerative-type uptake is present. Electronically Signed   By: David  Martinique M.D.   On: 05/12/2015 16:56   Ct Abdomen Pelvis W Contrast  05/12/2015  CLINICAL DATA:  Bilateral cancer. Chronic back pain. Abdominal discomfort. Recent move from [place] to [place]. EXAM: CT CHEST, ABDOMEN, AND PELVIS WITH CONTRAST TECHNIQUE: Multidetector CT imaging of the chest, abdomen and pelvis was performed following the standard protocol during bolus administration of intravenous contrast. CONTRAST:  166mL OMNIPAQUE IOHEXOL 350 MG/ML SOLN COMPARISON:  Chest radiograph from 11/19/2014 FINDINGS: CT CHEST FINDINGS Mediastinum/Nodes: 9 mm right lower paratracheal lymph node, image 91 series 7. No significant atherosclerosis.  The esophagus unremarkable. Lungs/Pleura: Interstitial accentuation anteriorly in both upper lobes, possibly partially related to radiation therapy. However, there is also some lower lobe interstitial accentuation  suggesting mild peripheral fibrosis. No overt honeycombing although there is some cylindrical bronchiectasis in both lower lobes. Dependent subsegmental atelectasis in both lower lobes with very faint nodularity but no solid nodule observed. Musculoskeletal: Bilateral breast implants.  Thoracic spondylosis. CT ABDOMEN PELVIS FINDINGS Hepatobiliary: Unremarkable Pancreas: Unremarkable Spleen: Unremarkable Adrenals/Urinary Tract: Peripheral hypodense 7 mm lesion of the right mid kidney posteriorly on image 17 series 4, likely cysts but technically nonspecific due to small size. Mild cortical thinning in parts of the left kidney upper pole compatible mild scarring. Stomach/Bowel: Prominent stool throughout the colon favors constipation. Vascular/Lymphatic: Unremarkable Reproductive: Calcifications the left of the uterus are likely vascular. Possible posterior uterine fibroid. Other: No supplemental non-categorized findings. Musculoskeletal: Lower lumbar facet arthropathy especially on the right with grade 1 degenerative anterolisthesis at L4-5. Right foraminal impingement at L3-4, L4-5, and L5-S1; and left foraminal impingement at L4-5 and L5-S1. IMPRESSION: 1. No findings of active malignancy in the chest, abdomen, or pelvis. 2. Peripheral fibrosis in the lungs. 3.  Prominent stool throughout the colon favors constipation. 4. Possible posterior uterine fibroid. 5. Lower lumbar spondylosis and degenerative disc  disease causing multilevel foraminal impingement. Electronically Signed   By: Van Clines M.D.   On: 05/12/2015 15:35    ASSESSMENT & PLAN:  # Bilateral breast cancer- metachronous [id number] and 2001]; status post mastectomy. As per patient appears to be ER/PR positive. Clinically no evidence of recurrence noted. Imaging- CT chest and pelvis no concerns for any recurrent malignancy. Bone scan again shows no evidence of any recurrent malignancy.  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 Recommend follow up with PCP/orthopedic.  # Incidentally mild peripheral lung fibrosis noted- asymptomatic. Reviewed with the patient.  #  Recommend follow-up in one year with labs.  All questions were answered. The patient knows to call the clinic with any problems, questions or concerns. I independently reviewed the images myself/summarized above.  15 minutes face-to-face spent with the patient; more than 50% of time on counseling and coordination of above medical care.     Cammie Sickle, MD 05/14/2015 11:17 AM

## 2015-05-14 NOTE — Progress Notes (Signed)
Patient is here for follow-up of CT and Bone Scan results. Patient states that she has pain in her back and rates her pain a 8/10.

## 2015-05-28 DIAGNOSIS — M545 Low back pain, unspecified: Secondary | ICD-10-CM | POA: Insufficient documentation

## 2015-05-28 DIAGNOSIS — K219 Gastro-esophageal reflux disease without esophagitis: Secondary | ICD-10-CM | POA: Insufficient documentation

## 2015-05-28 DIAGNOSIS — G8929 Other chronic pain: Secondary | ICD-10-CM | POA: Insufficient documentation

## 2015-05-28 DIAGNOSIS — I119 Hypertensive heart disease without heart failure: Secondary | ICD-10-CM | POA: Insufficient documentation

## 2015-06-06 ENCOUNTER — Emergency Department: Payer: Medicare Other

## 2015-06-06 ENCOUNTER — Emergency Department
Admission: EM | Admit: 2015-06-06 | Discharge: 2015-06-06 | Disposition: A | Discharge status: Home / Self Care | Payer: Medicare Other | Attending: Emergency Medicine | Admitting: Emergency Medicine

## 2015-06-06 DIAGNOSIS — Z880 Allergy status to penicillin: Secondary | ICD-10-CM | POA: Diagnosis not present

## 2015-06-06 DIAGNOSIS — Z79899 Other long term (current) drug therapy: Secondary | ICD-10-CM | POA: Insufficient documentation

## 2015-06-06 DIAGNOSIS — I10 Essential (primary) hypertension: Secondary | ICD-10-CM | POA: Diagnosis not present

## 2015-06-06 DIAGNOSIS — R109 Unspecified abdominal pain: Secondary | ICD-10-CM

## 2015-06-06 DIAGNOSIS — F17210 Nicotine dependence, cigarettes, uncomplicated: Secondary | ICD-10-CM | POA: Diagnosis not present

## 2015-06-06 DIAGNOSIS — R1030 Lower abdominal pain, unspecified: Secondary | ICD-10-CM | POA: Diagnosis not present

## 2015-06-06 LAB — URINALYSIS COMPLETE WITH MICROSCOPIC (ARMC ONLY)
BACTERIA UA: NONE SEEN
Bilirubin Urine: NEGATIVE
Glucose, UA: NEGATIVE mg/dL
Hgb urine dipstick: NEGATIVE
Ketones, ur: NEGATIVE mg/dL
LEUKOCYTES UA: NEGATIVE
NITRITE: NEGATIVE
PROTEIN: NEGATIVE mg/dL
Specific Gravity, Urine: 1.016 (ref 1.005–1.030)
pH: 6 (ref 5.0–8.0)

## 2015-06-06 LAB — CBC WITH DIFFERENTIAL/PLATELET
Basophils Absolute: 0.1 10*3/uL (ref 0–0.1)
Basophils Relative: 1 %
EOS PCT: 2 %
Eosinophils Absolute: 0.1 10*3/uL (ref 0–0.7)
HCT: 35.7 % (ref 35.0–47.0)
HEMOGLOBIN: 12 g/dL (ref 12.0–16.0)
LYMPHS ABS: 2 10*3/uL (ref 1.0–3.6)
LYMPHS PCT: 38 %
MCH: 29.8 pg (ref 26.0–34.0)
MCHC: 33.6 g/dL (ref 32.0–36.0)
MCV: 88.6 fL (ref 80.0–100.0)
MONOS PCT: 13 %
Monocytes Absolute: 0.7 10*3/uL (ref 0.2–0.9)
Neutro Abs: 2.5 10*3/uL (ref 1.4–6.5)
Neutrophils Relative %: 46 %
Platelets: 243 10*3/uL (ref 150–440)
RBC: 4.03 MIL/uL (ref 3.80–5.20)
RDW: 15.2 % — AB (ref 11.5–14.5)
WBC: 5.4 10*3/uL (ref 3.6–11.0)

## 2015-06-06 LAB — COMPREHENSIVE METABOLIC PANEL
ALBUMIN: 3.6 g/dL (ref 3.5–5.0)
ALT: 15 U/L (ref 14–54)
ANION GAP: 4 — AB (ref 5–15)
AST: 17 U/L (ref 15–41)
Alkaline Phosphatase: 54 U/L (ref 38–126)
BILIRUBIN TOTAL: 0.5 mg/dL (ref 0.3–1.2)
BUN: 13 mg/dL (ref 6–20)
CHLORIDE: 107 mmol/L (ref 101–111)
CO2: 25 mmol/L (ref 22–32)
Calcium: 9.6 mg/dL (ref 8.9–10.3)
Creatinine, Ser: 0.68 mg/dL (ref 0.44–1.00)
GFR calc Af Amer: >60 mL/min (ref >60)
GFR calc non Af Amer: >60 mL/min (ref >60)
GLUCOSE: 103 mg/dL — AB (ref 65–99)
POTASSIUM: 4.1 mmol/L (ref 3.5–5.1)
SODIUM: 136 mmol/L (ref 135–145)
Total Protein: 8.3 g/dL — ABNORMAL HIGH (ref 6.5–8.1)

## 2015-06-06 LAB — LACTIC ACID, PLASMA
LACTIC ACID, VENOUS: 1.1 mmol/L (ref 0.5–2.0)
Lactic Acid, Venous: 1.1 mmol/L (ref 0.5–2.0)

## 2015-06-06 LAB — LIPASE, BLOOD: Lipase: 17 U/L (ref 11–51)

## 2015-06-06 MED ORDER — ONDANSETRON HCL 4 MG/2ML IJ SOLN
4.0000 mg | Freq: Once | INTRAMUSCULAR | Status: AC
  Administered 2015-06-06: 4 mg via INTRAVENOUS
  Filled 2015-06-06: qty 2

## 2015-06-06 MED ORDER — SODIUM CHLORIDE 0.9 % IV BOLUS (SEPSIS)
1000.0000 mL | Freq: Once | INTRAVENOUS | Status: AC
  Administered 2015-06-06: 1000 mL via INTRAVENOUS

## 2015-06-06 MED ORDER — IOHEXOL 300 MG/ML  SOLN
100.0000 mL | Freq: Once | INTRAMUSCULAR | Status: AC | PRN
  Administered 2015-06-06: 100 mL via INTRAVENOUS

## 2015-06-06 MED ORDER — POLYETHYLENE GLYCOL 3350 17 G PO PACK: 17.0000 g | PACK | Freq: Every day | ORAL | Status: DC

## 2015-06-06 MED ORDER — DICYCLOMINE HCL 20 MG PO TABS: 20.0000 mg | ORAL_TABLET | Freq: Three times a day (TID) | ORAL | Status: DC | PRN

## 2015-06-06 MED ORDER — CYCLOBENZAPRINE HCL 10 MG PO TABS
5.0000 mg | ORAL_TABLET | Freq: Once | ORAL | Status: AC
Start: 2015-06-06 — End: 2015-06-06
  Administered 2015-06-06: 5 mg via ORAL
  Filled 2015-06-06: qty 1

## 2015-06-06 MED ORDER — DICYCLOMINE HCL 10 MG/ML IM SOLN
20.0000 mg | Freq: Once | INTRAMUSCULAR | Status: AC
  Administered 2015-06-06: 20 mg via INTRAMUSCULAR
  Filled 2015-06-06: qty 2

## 2015-06-06 MED ORDER — IOHEXOL 240 MG/ML SOLN
25.0000 mL | Freq: Once | INTRAMUSCULAR | Status: AC | PRN
  Administered 2015-06-06: 25 mL via ORAL

## 2015-06-06 MED ORDER — MORPHINE SULFATE (PF) 4 MG/ML IV SOLN
4.0000 mg | Freq: Once | INTRAVENOUS | Status: AC
Start: 2015-06-06 — End: 2015-06-06
  Administered 2015-06-06: 4 mg via INTRAVENOUS
  Filled 2015-06-06: qty 1

## 2015-06-06 NOTE — ED Notes (Signed)
[facility] at bedside. Pt in bathroom

## 2015-06-06 NOTE — ED Notes (Signed)
Pt called out reporting finished drinking PO contrast. Called CT and made aware.

## 2015-06-06 NOTE — ED Notes (Signed)
Patient transported to CT 

## 2015-06-06 NOTE — ED Provider Notes (Signed)
[facility] Emergency Department Provider Note   ____________________________________________  Time seen: On EMS arrival  I have reviewed the triage vital signs and the nursing notes.   HISTORY  Chief Complaint Abdominal Pain   History limited by: Not Limited   HPI Alexis Arias is a 74 y.o. female who presents to the emergency department today via EMS because of concerns for abdominal pain. She states she has been having some abdominal discomfort for the past week. She states that it started after she was switched to metoprolol. She states that the pain is all across her lower abdomen. She does not think that it is more concentrated on one side or the other. She states that for the past 2-3 days the pain has become severe. She describes it as a labor type pain. Will have intermittent sharp episodes of very severe pain. She feels like when she concentrates on her breathing gets better. She has not noticed any change in her stool, diarrhea or bloody stool. She has not had any nausea or vomiting. She denies any fevers.     Past Medical History  Diagnosis Date  . Neuropathy ([place])   . Hypertension   . Breast cancer ([place]) 2001    right breast-followed by modified radical mastectomy and chemotherapy  . Breast cancer ([place]) 1992    left-post mastectomy and "hormonal manipulation"  . History of chemotherapy 2001  . Hyperlipidemia 10/2014  . Osteoporosis 2012    tx with reclast  . Constipation, chronic 07/2013  . Insomnia   . Hepatic steatosis   . Acute vaginitis     Patient Active Problem List   Diagnosis Date Noted  . Breast cancer in female [facility]) 04/29/2015    Past Surgical History  Procedure Laterality Date  . Breast surgery    . Ovaries removed    . Breast reconstruction Bilateral   . Hysteroscopy w/d&c N/A 11/26/2014    Procedure: DILATATION AND CURETTAGE /HYSTEROSCOPY;  Surgeon: Honor Loh Ward, MD;  Location: [facility] ORS;  Service: Gynecology;   Laterality: N/A;  . Colonoscopy  2012  . Upper gi endoscopy  07/2013  . Dilation and curettage of uterus  2016    Current Outpatient Rx  Name  Route  Sig  Dispense  Refill  . losartan-hydrochlorothiazide (HYZAAR) 100-12.5 MG per tablet   Oral   Take 1 tablet by mouth every morning.          Marland Kitchen losartan-hydrochlorothiazide (HYZAAR) 100-12.5 MG tablet   Oral   Take 100 tablets by mouth daily.         . metroNIDAZOLE (METROGEL) 0.75 % vaginal gel   Vaginal   Place [id number] Applicatorfuls vaginally as needed.         Marland Kitchen QUEtiapine (SEROQUEL) 50 MG tablet   Oral   Take 50 mg by mouth at bedtime.          Marland Kitchen QUEtiapine (SEROQUEL) 50 MG tablet   Oral   Take 50 tablets by mouth daily.         Marland Kitchen terconazole (TERAZOL 7) 0.4 % vaginal cream   Vaginal   Place vaginally.           Allergies Penicillin g and Lipitor  Family History  Problem Relation Age of Onset  . Heart disease Mother   . Alzheimer's disease Father     Social History Social History  Substance Use Topics  . Smoking status: Current Some Day Smoker    Types: Cigarettes  .  Smokeless tobacco: Never Used     Comment: 1 cigarette a day  . Alcohol Use: No    Review of Systems  Constitutional: Negative for fever. Cardiovascular: Negative for chest pain. Respiratory: Negative for shortness of breath. Gastrointestinal: Positive for lower abdominal pain Genitourinary: Negative for dysuria. Musculoskeletal: Negative for back pain. Skin: Negative for rash. Neurological: Negative for headaches, focal weakness or numbness.   10-point ROS otherwise negative.  ____________________________________________   PHYSICAL EXAM:  VITAL SIGNS:   98.3 F (36.8 C)  71   22   189/135 mmHg  100 %     Constitutional: Alert and oriented. Appears mildly uncomfortable. Eyes: Conjunctivae are normal. PERRL. Normal extraocular movements. ENT   Head: Normocephalic and atraumatic.   Nose: No  congestion/rhinnorhea.   Mouth/Throat: Mucous membranes are moist.   Neck: No stridor. Hematological/Lymphatic/Immunilogical: No cervical lymphadenopathy. Cardiovascular: Normal rate, regular rhythm.  No murmurs, rubs, or gallops. Respiratory: Normal respiratory effort without tachypnea nor retractions. Breath sounds are clear and equal bilaterally. No wheezes/rales/rhonchi. Gastrointestinal: Soft. Mildly tender across the lower abdomen. No tenderness to percussion. No rebound. No guarding. No distention. Genitourinary: Deferred Musculoskeletal: Normal range of motion in all extremities. No joint effusions.  No lower extremity tenderness nor edema. Neurologic:  Normal speech and language. No gross focal neurologic deficits are appreciated.  Skin:  Skin is warm, dry and intact. No rash noted. Psychiatric: Mood and affect are normal. Speech and behavior are normal. Patient exhibits appropriate insight and judgment.  ____________________________________________    LABS (pertinent positives/negatives)  Labs Reviewed  CBC WITH DIFFERENTIAL/PLATELET - Abnormal; Notable for the following:    RDW 15.2 (*)    All other components within normal limits  COMPREHENSIVE METABOLIC PANEL - Abnormal; Notable for the following:    Glucose, Bld 103 (*)    Total Protein 8.3 (*)    Anion gap 4 (*)    All other components within normal limits  URINALYSIS COMPLETEWITH MICROSCOPIC ([facility] ONLY) - Abnormal; Notable for the following:    Color, Urine STRAW (*)    APPearance CLEAR (*)    Squamous Epithelial / LPF 0-5 (*)    All other components within normal limits  LIPASE, BLOOD  LACTIC ACID, PLASMA  LACTIC ACID, PLASMA     ____________________________________________   EKG  None  ____________________________________________    RADIOLOGY  IMPRESSION: 1. Possible 2.2 cm mass involving the proximal body of the pancreas (versus heterogeneous contrast enhancement). Non-emergent MRI of  the abdomen without and with contrast may be confirmatory. 2. No acute abnormalities otherwise involving the abdomen or pelvis. Large colonic stool burden. 3. Approximate 2.3 cm fibroid arising from the lower uterine segment. 4. Large right lateral/foraminal disc extrusion at L4-5. Other osseous findings as above. 5. Scar and bronchiectasis involving the lower lobes. Mild right lower lobe atelectasis.  ____________________________________________   PROCEDURES  Procedure(s) performed: None  Critical Care performed: No  ____________________________________________   INITIAL IMPRESSION / ASSESSMENT AND PLAN / ED COURSE  Pertinent labs & imaging results that were available during my care of the patient were reviewed by me and considered in my medical decision making (see chart for details).  Patient presented to the emergency department today because of concern for lower abdominal pain. CT without obvious etiology of the pain. Blood work without any concerning signs. Unclear etiology although patient did state she started feeling better after medication. Will have patient follow up with her primary care doctor.   ____________________________________________   FINAL CLINICAL IMPRESSION(S) /  ED DIAGNOSES  Final diagnoses:  Abdominal pain, unspecified abdominal location     Nance Pear, MD 06/06/15 [phone]

## 2015-06-06 NOTE — Discharge Instructions (Signed)
Please seek medical attention for any high fevers, chest pain, shortness of breath, change in behavior, persistent vomiting, bloody stool or any other new or concerning symptoms. ° ° °Abdominal Pain, Adult °Many things can cause abdominal pain. Usually, abdominal pain is not caused by a disease and will improve without treatment. It can often be observed and treated at home. Your health care provider will do a physical exam and possibly order blood tests and X-rays to help determine the seriousness of your pain. However, in many cases, more time must pass before a clear cause of the pain can be found. Before that point, your health care provider may not know if you need more testing or further treatment. °HOME CARE INSTRUCTIONS °Monitor your abdominal pain for any changes. The following actions may help to alleviate any discomfort you are experiencing: °· Only take over-the-counter or prescription medicines as directed by your health care provider. °· Do not take laxatives unless directed to do so by your health care provider. °· Try a clear liquid diet (broth, tea, or water) as directed by your health care provider. Slowly move to a bland diet as tolerated. °SEEK MEDICAL CARE IF: °· You have unexplained abdominal pain. °· You have abdominal pain associated with nausea or diarrhea. °· You have pain when you urinate or have a bowel movement. °· You experience abdominal pain that wakes you in the night. °· You have abdominal pain that is worsened or improved by eating food. °· You have abdominal pain that is worsened with eating fatty foods. °· You have a fever. °SEEK IMMEDIATE MEDICAL CARE IF: °· Your pain does not go away within 2 hours. °· You keep throwing up (vomiting). °· Your pain is felt only in portions of the abdomen, such as the right side or the left lower portion of the abdomen. °· You pass bloody or black tarry stools. °MAKE SURE YOU: °· Understand these instructions. °· Will watch your  condition. °· Will get help right away if you are not doing well or get worse. °  °This information is not intended to replace advice given to you by your health care provider. Make sure you discuss any questions you have with your health care provider. °  °Document Released: 03/22/2005 Document Revised: 03/03/2015 Document Reviewed: 02/19/2013 °Elsevier Interactive Patient Education ©2016 [employer] ° °

## 2015-06-06 NOTE — ED Notes (Signed)
Pt presents via EMS c/o lower abd pain x3 days. Pt recently started metoprolol and states felt nauseous the first day and started taking at night due to same. Pt states abd pain gradually getting worse, pt tearful upon arrival. Archie Balboa MD at bedside.

## 2015-06-07 ENCOUNTER — Other Ambulatory Visit: Payer: Self-pay | Admitting: Internal Medicine

## 2015-06-07 ENCOUNTER — Telehealth: Payer: Self-pay | Admitting: Internal Medicine

## 2015-06-07 DIAGNOSIS — K8689 Other specified diseases of pancreas: Secondary | ICD-10-CM | POA: Insufficient documentation

## 2015-06-07 NOTE — Telephone Encounter (Signed)
Reviewed the recent  CT scan of the abdomen done in the emergency room for abdominal pain;  Recommend MRI of the abdomen for further evaluation.  This has been ordered.   Sherri, Please inform patient;  And have the patient follow-up with me  1-2 days after the MRI.

## 2015-06-07 NOTE — Telephone Encounter (Signed)
I have called pt and i got her voicemail and just asked her to call me to discuss ct results and the plan dr B would like to have for her.  I left her my number to call

## 2015-06-08 ENCOUNTER — Telehealth: Payer: Self-pay | Admitting: *Deleted

## 2015-06-08 NOTE — Progress Notes (Signed)
This encounter was created in error - please disregard.

## 2015-06-08 NOTE — Telephone Encounter (Signed)
Pt was contacted yest. And she called back and was told that there was something on ct in the pancreas area and the radiologist rec: that pt have MRI to get better picture of the pancreas to see if they can determine exactly if there is something there or not.  Pt cont. To be in pain and pt states it is from need to have3 bowel movement.  Pt states that she has tried miralax and Amitiza and it has not worked.  She states that before she left ER this weekend she had BM and she feels like it is from the contrast she drank for CT.  I had called Dr. B and asked about magnesium citrate, perhaps fleets enema.  And he said it is ok but with her abd. Pain and BM issues she should contact her PCP.  I had called pt back to give her date of the MRI that she is agreeable for and told her the above suggestions and she states that she has already tried [employer]. Citrate.  She is in severe pain and no one can tell her anything but she has unspecified abd. Pain.  She also states she called Dr. Candiss Norse office yest. And all they would say is that she went to ER and her other doctor is sch. MRI.  She states her son says that he should take her to [facility].  I told her the date of the MRI which is 12/19 and she felt that was too long to wait.  I told her that the scheduler checked [place] location and this one for 12/19 is the soonest it could be done and if she cont. To have pain and no BM that she would need to go to ER.  She was telling me that she is rating her pain a 10 and she does not know what we feel as an emergency but she felt like it was.  I told her that the MRI contrast is not the same as CT contrast.  We want the MRI asap to have a better picture of what is going on with pancreas but the test is not going to make her have BM.  She put me on hold due to the pain and then i talked to her some more and then I checked to make sure there were no other appt sooner than 12/19 and there was not and in between she spoke to her lawyer friend  from [place] and said that pt is use to a faster pace in [place] and [place] is slower pace and pt was fine now to have the MRI on Monday and she was going to try another mag citrate and I advised her if the pain got unbearable for her then she would need to seek ER for emergency asst.  She is going to rest and drink another coffee and try to relax to see if she would feel better.  She has MRI appt and f/u with md appt.

## 2015-06-14 ENCOUNTER — Ambulatory Visit
Admission: RE | Admit: 2015-06-14 | Discharge: 2015-06-14 | Disposition: A | Discharge status: Home / Self Care | Payer: Medicare Other | Source: Ambulatory Visit | Attending: Internal Medicine | Admitting: Internal Medicine

## 2015-06-14 DIAGNOSIS — R935 Abnormal findings on diagnostic imaging of other abdominal regions, including retroperitoneum: Secondary | ICD-10-CM | POA: Insufficient documentation

## 2015-06-14 DIAGNOSIS — K869 Disease of pancreas, unspecified: Secondary | ICD-10-CM | POA: Diagnosis present

## 2015-06-14 DIAGNOSIS — K8689 Other specified diseases of pancreas: Secondary | ICD-10-CM

## 2015-06-14 MED ORDER — GADOBENATE DIMEGLUMINE 529 MG/ML IV SOLN
20.0000 mL | Freq: Once | INTRAVENOUS | Status: AC | PRN
  Administered 2015-06-14: 18 mL via INTRAVENOUS

## 2015-06-15 ENCOUNTER — Ambulatory Visit (INDEPENDENT_AMBULATORY_CARE_PROVIDER_SITE_OTHER): Payer: Medicare Other | Admitting: Sports Medicine

## 2015-06-15 ENCOUNTER — Encounter: Payer: Self-pay | Admitting: Sports Medicine

## 2015-06-15 ENCOUNTER — Telehealth: Payer: Self-pay | Admitting: Internal Medicine

## 2015-06-15 DIAGNOSIS — M79674 Pain in right toe(s): Secondary | ICD-10-CM | POA: Diagnosis not present

## 2015-06-15 DIAGNOSIS — M79675 Pain in left toe(s): Secondary | ICD-10-CM

## 2015-06-15 DIAGNOSIS — G620 Drug-induced polyneuropathy: Secondary | ICD-10-CM

## 2015-06-15 DIAGNOSIS — T451X5A Adverse effect of antineoplastic and immunosuppressive drugs, initial encounter: Secondary | ICD-10-CM

## 2015-06-15 DIAGNOSIS — B351 Tinea unguium: Secondary | ICD-10-CM

## 2015-06-15 DIAGNOSIS — M79673 Pain in unspecified foot: Secondary | ICD-10-CM

## 2015-06-15 NOTE — Progress Notes (Signed)
Patient ID: Alexis Arias, female   DOB: [birth year], 74 y.o.   MRN: [id number] Subjective: Alexis Arias is a 74 y.o. female patient seen today in office with complaint of painful thickened and elongated toenails; unable to trim. Patient denies history of Diabetes or Vascular disease. Patient has neuropathy after Chemo for breast cancer. Patient has no other pedal complaints at this time.   Patient Active Problem List   Diagnosis Date Noted  . Pancreatic mass 06/07/2015  . Breast cancer in female [facility]) 04/29/2015   Current Outpatient Prescriptions on File Prior to Visit  Medication Sig Dispense Refill  . dicyclomine (BENTYL) 20 MG tablet Take 1 tablet (20 mg total) by mouth 3 (three) times daily as needed for spasms. 30 tablet 0  . losartan-hydrochlorothiazide (HYZAAR) 100-12.5 MG per tablet Take 1 tablet by mouth every morning.     Marland Kitchen losartan-hydrochlorothiazide (HYZAAR) 100-12.5 MG tablet Take 100 tablets by mouth daily.    . metroNIDAZOLE (METROGEL) 0.75 % vaginal gel Place [id number] Applicatorfuls vaginally as needed.    . polyethylene glycol (MIRALAX / GLYCOLAX) packet Take 17 g by mouth daily. 14 each 0  . QUEtiapine (SEROQUEL) 50 MG tablet Take 50 mg by mouth at bedtime.     Marland Kitchen QUEtiapine (SEROQUEL) 50 MG tablet Take 50 tablets by mouth daily.    Marland Kitchen terconazole (TERAZOL 7) 0.4 % vaginal cream Place vaginally.     No current facility-administered medications on file prior to visit.   Allergies  Allergen Reactions  . Penicillin G     Other reaction(s): Dizziness, passed out  . Lipitor [Atorvastatin] Rash    Memory loss also   Objective: Physical Exam  General: Well developed, nourished, no acute distress, awake, alert and oriented x 3  Vascular: Dorsalis pedis artery 2/4 bilateral, Posterior tibial artery 1/4 bilateral, skin temperature warm to warm proximal to distal bilateral lower extremities, no varicosities, pedal hair present bilateral.  Neurological: Gross sensation present via  light touch bilateral. SWMF slightly decreased to toes bilateral.   Dermatological: Skin is warm, dry, and supple bilateral, Nails 1-10 are tender, long, thick, and discolored with mild subungal debris, no webspace macerations present bilateral, no open lesions present bilateral, no callus/corns/hyperkeratotic tissue present bilateral. No signs of infection bilateral.  Musculoskeletal: Asymptomatic HAV and pes planus deformities noted bilateral. Muscular strength within normal limits without pain or limitation on range of motion. No pain with calf compression bilateral.  Assessment and Plan:  Problem List Items Addressed This Visit    None    Visit Diagnoses    Dermatophytosis of nail    -  Primary    Pain in toes of both feet        Neuropathy due to chemotherapeutic drug ([place])          -Examined patient.  -Discussed treatment options for painful mycotic nails. -Mechanically debrided and reduced mycotic nails with sterile nail nipper and dremel nail file without incident. -Patient to return in 3 months for follow up evaluation or sooner if symptoms worsen.  Landis Martins, DPM

## 2015-06-15 NOTE — Telephone Encounter (Signed)
Please inform pt that her MRI of the abdomen- is negative for any abnormalities. No new recommendations from cancer standpoint. Follow up as planned. If any other issues she should follow up with her primary care doc.

## 2015-06-15 NOTE — Telephone Encounter (Signed)
Patient informed. Discussed scan results. She appreciated call.

## 2015-06-16 ENCOUNTER — Inpatient Hospital Stay: Payer: Medicare Other | Admitting: Internal Medicine

## 2015-08-26 DIAGNOSIS — Z8781 Personal history of (healed) traumatic fracture: Secondary | ICD-10-CM | POA: Insufficient documentation

## 2015-09-14 ENCOUNTER — Ambulatory Visit: Payer: Medicare Other | Admitting: Sports Medicine

## 2015-09-17 ENCOUNTER — Ambulatory Visit (INDEPENDENT_AMBULATORY_CARE_PROVIDER_SITE_OTHER): Payer: Medicare Other | Admitting: Sports Medicine

## 2015-09-17 ENCOUNTER — Encounter: Payer: Self-pay | Admitting: Sports Medicine

## 2015-09-17 DIAGNOSIS — G620 Drug-induced polyneuropathy: Secondary | ICD-10-CM

## 2015-09-17 DIAGNOSIS — M79675 Pain in left toe(s): Secondary | ICD-10-CM | POA: Diagnosis not present

## 2015-09-17 DIAGNOSIS — B351 Tinea unguium: Secondary | ICD-10-CM | POA: Diagnosis not present

## 2015-09-17 DIAGNOSIS — M79674 Pain in right toe(s): Secondary | ICD-10-CM | POA: Diagnosis not present

## 2015-09-17 DIAGNOSIS — T451X5A Adverse effect of antineoplastic and immunosuppressive drugs, initial encounter: Secondary | ICD-10-CM

## 2015-09-17 NOTE — Progress Notes (Signed)
Patient ID: Alexis Arias, female   DOB: [birth year], 75 y.o.   MRN: [id number]  Subjective: Alexis Arias is a 75 y.o. female patient returns to office with complaint of painful thickened and elongated toenails; unable to trim. Patient denies history of Diabetes or Vascular disease. Patient has neuropathy after Chemo for breast cancer. Admits to epidural for pancreas procedure and 1 episode of fall while she was in [place]. Patient has no other pedal complaints at this time.   Patient Active Problem List   Diagnosis Date Noted  . Personal history of healed traumatic fracture 08/26/2015  . Pancreatic mass 06/07/2015  . Chronic LBP 05/28/2015  . Gastro-esophageal reflux disease without esophagitis 05/28/2015  . Hypertensive heart disease without CHF 05/28/2015  . Dizziness 04/30/2015  . Breast cancer in female [facility]) 04/29/2015  . Combined fat and carbohydrate induced hyperlipemia 04/29/2015  . Degenerative arthritis of lumbar spine 04/29/2015  . Osteopenia 04/29/2015  . Malignant neoplasm of female breast ([place]) 04/08/2015  . Insomnia, persistent 04/08/2015  . Essential (primary) hypertension 04/08/2015  . OP (osteoporosis) 04/08/2015   Current Outpatient Prescriptions on File Prior to Visit  Medication Sig Dispense Refill  . dicyclomine (BENTYL) 20 MG tablet Take 1 tablet (20 mg total) by mouth 3 (three) times daily as needed for spasms. 30 tablet 0  . losartan-hydrochlorothiazide (HYZAAR) 100-12.5 MG per tablet Take 1 tablet by mouth every morning.     Marland Kitchen losartan-hydrochlorothiazide (HYZAAR) 100-12.5 MG tablet Take 100 tablets by mouth daily.    . metroNIDAZOLE (METROGEL) 0.75 % vaginal gel Place [id number] Applicatorfuls vaginally as needed.    . polyethylene glycol (MIRALAX / GLYCOLAX) packet Take 17 g by mouth daily. 14 each 0  . QUEtiapine (SEROQUEL) 50 MG tablet Take 50 mg by mouth at bedtime.     Marland Kitchen QUEtiapine (SEROQUEL) 50 MG tablet Take 50 tablets by mouth daily.    Marland Kitchen terconazole (TERAZOL 7) 0.4  % vaginal cream Place vaginally.     No current facility-administered medications on file prior to visit.   Allergies  Allergen Reactions  . Penicillin G     Other reaction(s): Dizziness, passed out  . Lipitor [Atorvastatin] Rash    Memory loss also   Objective: Physical Exam  General: Well developed, nourished, no acute distress, awake, alert and oriented x 3  Vascular: Dorsalis pedis artery 2/4 bilateral, Posterior tibial artery 1/4 bilateral, skin temperature warm to warm proximal to distal bilateral lower extremities, no varicosities, pedal hair present bilateral.  Neurological: Gross sensation present via light touch bilateral. SWMF slightly decreased to toes bilateral.   Dermatological: Skin is warm, dry, and supple bilateral, Nails 1-10 are tender, long, thick, and discolored with mild subungal debris, no webspace macerations present bilateral, no open lesions present bilateral, no callus/corns/hyperkeratotic tissue present bilateral. No signs of infection bilateral.  Musculoskeletal: Asymptomatic HAV and pes planus deformities noted bilateral. Muscular strength within normal limits without pain or limitation on range of motion. No pain with calf compression bilateral.  Assessment and Plan:  Problem List Items Addressed This Visit    None    Visit Diagnoses    Dermatophytosis of nail    -  Primary    Pain in toes of both feet        Neuropathy due to chemotherapeutic drug ([place])          -Examined patient.  -Discussed treatment options for painful mycotic nails. -Mechanically debrided and reduced mycotic nails with sterile nail nipper and dremel nail  file without incident. -Patient to return in 3 months for follow up evaluation or sooner if symptoms worsen.  Landis Martins, DPM

## 2015-12-24 ENCOUNTER — Ambulatory Visit: Payer: Medicare Other | Admitting: Sports Medicine

## 2016-01-25 DIAGNOSIS — S329XXA Fracture of unspecified parts of lumbosacral spine and pelvis, initial encounter for closed fracture: Secondary | ICD-10-CM

## 2016-01-25 HISTORY — DX: Fracture of unspecified parts of lumbosacral spine and pelvis, initial encounter for closed fracture: S32.9XXA

## 2016-05-09 ENCOUNTER — Encounter: Payer: Self-pay | Admitting: Podiatry

## 2016-05-09 ENCOUNTER — Ambulatory Visit (INDEPENDENT_AMBULATORY_CARE_PROVIDER_SITE_OTHER): Payer: Medicare Other | Admitting: Podiatry

## 2016-05-09 VITALS — Ht 68.0 in | Wt 212.0 lb

## 2016-05-09 DIAGNOSIS — L608 Other nail disorders: Secondary | ICD-10-CM

## 2016-05-09 DIAGNOSIS — M2011 Hallux valgus (acquired), right foot: Secondary | ICD-10-CM

## 2016-05-09 DIAGNOSIS — M79609 Pain in unspecified limb: Secondary | ICD-10-CM

## 2016-05-09 DIAGNOSIS — B351 Tinea unguium: Secondary | ICD-10-CM

## 2016-05-09 DIAGNOSIS — L603 Nail dystrophy: Secondary | ICD-10-CM

## 2016-05-09 DIAGNOSIS — M21611 Bunion of right foot: Secondary | ICD-10-CM

## 2016-05-12 ENCOUNTER — Inpatient Hospital Stay: Payer: Medicare Other | Attending: Internal Medicine | Admitting: Internal Medicine

## 2016-05-12 ENCOUNTER — Inpatient Hospital Stay: Payer: Medicare Other

## 2016-05-12 VITALS — BP 123/93 | HR 79 | Temp 98.2°F | Resp 18

## 2016-05-12 DIAGNOSIS — M810 Age-related osteoporosis without current pathological fracture: Secondary | ICD-10-CM | POA: Insufficient documentation

## 2016-05-12 DIAGNOSIS — M8580 Other specified disorders of bone density and structure, unspecified site: Secondary | ICD-10-CM | POA: Diagnosis not present

## 2016-05-12 DIAGNOSIS — E785 Hyperlipidemia, unspecified: Secondary | ICD-10-CM | POA: Diagnosis not present

## 2016-05-12 DIAGNOSIS — Z79899 Other long term (current) drug therapy: Secondary | ICD-10-CM | POA: Insufficient documentation

## 2016-05-12 DIAGNOSIS — I10 Essential (primary) hypertension: Secondary | ICD-10-CM | POA: Insufficient documentation

## 2016-05-12 DIAGNOSIS — Z853 Personal history of malignant neoplasm of breast: Secondary | ICD-10-CM | POA: Diagnosis not present

## 2016-05-12 DIAGNOSIS — Z9012 Acquired absence of left breast and nipple: Secondary | ICD-10-CM | POA: Diagnosis not present

## 2016-05-12 DIAGNOSIS — Z170 Estrogen receptor positive status [ER+]: Secondary | ICD-10-CM | POA: Diagnosis not present

## 2016-05-12 DIAGNOSIS — C50811 Malignant neoplasm of overlapping sites of right female breast: Secondary | ICD-10-CM | POA: Insufficient documentation

## 2016-05-12 DIAGNOSIS — Z9221 Personal history of antineoplastic chemotherapy: Secondary | ICD-10-CM | POA: Diagnosis not present

## 2016-05-12 DIAGNOSIS — C50111 Malignant neoplasm of central portion of right female breast: Secondary | ICD-10-CM

## 2016-05-12 DIAGNOSIS — F17210 Nicotine dependence, cigarettes, uncomplicated: Secondary | ICD-10-CM | POA: Diagnosis not present

## 2016-05-12 LAB — COMPREHENSIVE METABOLIC PANEL
ALK PHOS: 54 U/L (ref 38–126)
ALT: 15 U/L (ref 14–54)
ANION GAP: 5 (ref 5–15)
AST: 21 U/L (ref 15–41)
Albumin: 4 g/dL (ref 3.5–5.0)
BILIRUBIN TOTAL: 0.5 mg/dL (ref 0.3–1.2)
BUN: 15 mg/dL (ref 6–20)
CALCIUM: 10.2 mg/dL (ref 8.9–10.3)
CO2: 26 mmol/L (ref 22–32)
Chloride: 105 mmol/L (ref 101–111)
Creatinine, Ser: 0.77 mg/dL (ref 0.44–1.00)
Glucose, Bld: 95 mg/dL (ref 65–99)
POTASSIUM: 4.1 mmol/L (ref 3.5–5.1)
Sodium: 136 mmol/L (ref 135–145)
TOTAL PROTEIN: 8.9 g/dL — AB (ref 6.5–8.1)

## 2016-05-12 LAB — CBC WITH DIFFERENTIAL/PLATELET
BASOS PCT: 1 %
Basophils Absolute: 0.1 10*3/uL (ref 0–0.1)
Eosinophils Absolute: 0.3 10*3/uL (ref 0–0.7)
Eosinophils Relative: 5 %
HEMATOCRIT: 38.2 % (ref 35.0–47.0)
HEMOGLOBIN: 13 g/dL (ref 12.0–16.0)
LYMPHS ABS: 2.5 10*3/uL (ref 1.0–3.6)
LYMPHS PCT: 43 %
MCH: 30.2 pg (ref 26.0–34.0)
MCHC: 34.1 g/dL (ref 32.0–36.0)
MCV: 88.5 fL (ref 80.0–100.0)
MONO ABS: 0.8 10*3/uL (ref 0.2–0.9)
MONOS PCT: 14 %
NEUTROS ABS: 2.2 10*3/uL (ref 1.4–6.5)
NEUTROS PCT: 37 %
Platelets: 283 10*3/uL (ref 150–440)
RBC: 4.31 MIL/uL (ref 3.80–5.20)
RDW: 13.9 % (ref 11.5–14.5)
WBC: 5.8 10*3/uL (ref 3.6–11.0)

## 2016-05-12 NOTE — Assessment & Plan Note (Addendum)
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 As per patient appears to be ER/PR positive. Clinically no evidence of recurrence.   # Osteopenia- BMD Oct 2016- plan to start reclast/ zometa [5mg IV q 12 months] Recommend vit D + Calcium. Start next week.   # follow up in 12 months/labs/reclast.  

## 2016-05-12 NOTE — Progress Notes (Signed)
Patient is here for follow up, she is doing well has a question about reclast and vitamin d

## 2016-05-12 NOTE — Progress Notes (Signed)
[place] NOTE  Patient Care Team: Glendon Axe, MD as PCP - General (Internal Medicine)  CHIEF COMPLAINTS/PURPOSE OF CONSULTATION:   Oncology History   # 1992- LEFT BREAST CANCER s/p Mastec; tamoxifen; # 2001- RIGHT BREAST CANCER s/p Mastec; chemo; Tamoxifen [Dr.Kasari;Oncology,[place]; [place]]; NOV 2016- CT-C/A/P- NED; MRI abdo-NEG.   # OCT 2016- BMD- Osteopenia on Reclast q 12; D&C June 2016- Neg for malignancy; Bil. Oopherectomy;   # Chronic LOW back pain/arthiris-      Carcinoma of overlapping sites of right breast in female, estrogen receptor positive ([place])      HISTORY OF PRESENTING ILLNESS:  Alexis Arias 75 y.o.  female above history of bilateral breast cancer; most recent one in 2001- Is here for follow-up.  Patient interim had a fall and had a fracture of her foot; was a rehabilitation currently at home. Patient is worried about osteopenia/osteoporosis. Interest in having Reclast; she had good results in the past.  Patient continues to complain of chronic worsening back pain; patient has had steroid injections in the past. No bone pain. No lumps or bumps.  ROS:No new shortness of breath or cough. No new lumps or bumps.  A complete 10 point review of system is done which is negative except mentioned above in history of present illness. MEDICAL HISTORY:  Past Medical History:  Diagnosis Date  . Acute vaginitis   . Breast cancer ([place]) 2001   right breast-followed by modified radical mastectomy and chemotherapy  . Breast cancer ([place]) 1992   left-post mastectomy and "hormonal manipulation"  . Constipation, chronic 07/2013  . Hepatic steatosis   . History of chemotherapy 2001  . Hyperlipidemia 10/2014  . Hypertension   . Insomnia   . Neuropathy ([place])   . Osteoporosis 2012   tx with reclast    SURGICAL HISTORY: Past Surgical History:  Procedure Laterality Date  . BREAST RECONSTRUCTION Bilateral   . BREAST SURGERY    . COLONOSCOPY  2012  .  [place] OF UTERUS  2016  . HYSTEROSCOPY W/D&C N/A 11/26/2014   Procedure: DILATATION AND CURETTAGE /HYSTEROSCOPY;  Surgeon: Honor Loh Ward, MD;  Location: [facility] ORS;  Service: Gynecology;  Laterality: N/A;  . ovaries removed    . UPPER GI ENDOSCOPY  07/2013    SOCIAL HISTORY: Social History   Social History  . Marital status: Widowed    Spouse name: N/A  . Number of children: N/A  . Years of education: N/A   Occupational History  . Not on file.   Social History Main Topics  . Smoking status: Current Some Day Smoker    Types: Cigarettes  . Smokeless tobacco: Never Used     Comment: 1 cigarette a day  . Alcohol use No  . Drug use: No  . Sexual activity: Yes    Birth control/ protection: Post-menopausal   Other Topics Concern  . Not on file   Social History Narrative  . No narrative on file    FAMILY HISTORY: Family History  Problem Relation Age of Onset  . Heart disease Mother   . Alzheimer's disease Father     ALLERGIES:  is allergic to penicillin g; cymbalta [duloxetine hcl]; and lipitor [atorvastatin].  MEDICATIONS:  Current Outpatient Prescriptions  Medication Sig Dispense Refill  . acetaminophen (TYLENOL) 325 MG tablet Take by mouth.    . Chromium Picolinate (CHROMIUM PICOLATE PO) Take by mouth.    . losartan-hydrochlorothiazide (HYZAAR) 100-12.5 MG per tablet Take 1 tablet by mouth  every morning.     . Multiple Vitamin (MULTI-VITAMINS) TABS Take by mouth.    . polyethylene glycol (MIRALAX / GLYCOLAX) packet Take 17 g by mouth daily. 14 each 0  . QUEtiapine (SEROQUEL) 50 MG tablet Take 50 mg by mouth at bedtime.     . Vitamin D, Ergocalciferol, (DRISDOL) 50000 units CAPS capsule Take by mouth.    . dicyclomine (BENTYL) 20 MG tablet Take 1 tablet (20 mg total) by mouth 3 (three) times daily as needed for spasms. (Patient not taking: Reported on 05/12/2016) 30 tablet 0  . ezetimibe (ZETIA) 10 MG tablet Take by mouth.    . lansoprazole (PREVACID) 30  MG capsule Take by mouth.    . metroNIDAZOLE (METROGEL) 0.75 % vaginal gel Place [id number] Applicatorfuls vaginally as needed.    . Omega-3 Fatty Acids (FISH OIL PO) Take by mouth.    . terconazole (TERAZOL 7) 0.4 % vaginal cream Place vaginally.     No current facility-administered medications for this visit.       Marland Kitchen  PHYSICAL EXAMINATION: ECOG PERFORMANCE STATUS: 0 - Asymptomatic  Vitals:   05/12/16 1123  BP: (!) 123/93  Pulse: 79  Resp: 18  Temp: 98.2 F (36.8 C)   There were no vitals filed for this visit.  GENERAL: Well-nourished well-developed; Alert, no distress and comfortable. She is alone. HEENT-Atraumatic normocephalic pupils equal reactive to light. Cardiovascular-regular rate and rhythm and no murmurs. No lower extremity edema Lungs-clear to auscultation bilaterally. No wheeze or crackles Neurologically alert and oriented 3. No focal deficits. Abdomen soft nontender nondistended. No hepatomegaly.     LABORATORY DATA:  I have reviewed the data as listed Lab Results  Component Value Date   WBC 5.8 05/12/2016   HGB 13.0 05/12/2016   HCT 38.2 05/12/2016   MCV 88.5 05/12/2016   PLT 283 05/12/2016    Recent Labs  06/06/15 0743 05/12/16 1047  NA 136 136  K 4.1 4.1  CL 107 105  CO2 25 26  GLUCOSE 103* 95  BUN 13 15  CREATININE 0.68 0.77  CALCIUM 9.6 10.2  GFRNONAA >60 >60  GFRAA >60 >60  PROT 8.3* 8.9*  ALBUMIN 3.6 4.0  AST 17 21  ALT 15 15  ALKPHOS 54 54  BILITOT 0.5 0.5    RADIOGRAPHIC STUDIES: I have personally reviewed the radiological images as listed and agreed with the findings in the report. No results found.  ASSESSMENT & PLAN:  Carcinoma of overlapping sites of right breast in female, estrogen receptor positive ([place]) # Bilateral breast cancer- metachronous [phone] and 2001]; status post mastectomy. As per patient appears to be ER/PR positive. Clinically no evidence of recurrence.   # Osteopenia- BMD Oct 2016- plan to start reclast/  zometa [5mg  IV q 12 months] Recommend vit D + Calcium. Start next week.   # follow up in 12 months/labs/reclast.        Cammie Sickle, MD 05/12/2016 12:07 PM

## 2016-05-17 ENCOUNTER — Inpatient Hospital Stay: Payer: Medicare Other

## 2016-05-17 VITALS — BP 126/84 | HR 77 | Temp 96.0°F | Resp 18

## 2016-05-17 DIAGNOSIS — C50811 Malignant neoplasm of overlapping sites of right female breast: Secondary | ICD-10-CM

## 2016-05-17 DIAGNOSIS — M8580 Other specified disorders of bone density and structure, unspecified site: Secondary | ICD-10-CM

## 2016-05-17 DIAGNOSIS — Z170 Estrogen receptor positive status [ER+]: Principal | ICD-10-CM

## 2016-05-17 DIAGNOSIS — M810 Age-related osteoporosis without current pathological fracture: Secondary | ICD-10-CM

## 2016-05-17 DIAGNOSIS — Z853 Personal history of malignant neoplasm of breast: Secondary | ICD-10-CM | POA: Diagnosis not present

## 2016-05-17 DIAGNOSIS — M818 Other osteoporosis without current pathological fracture: Secondary | ICD-10-CM

## 2016-05-17 MED ORDER — SODIUM CHLORIDE 0.9 % IV SOLN: Freq: Once | INTRAVENOUS | Status: DC

## 2016-05-17 MED ORDER — SODIUM CHLORIDE 0.9 % IV SOLN
Freq: Once | INTRAVENOUS | Status: AC
  Administered 2016-05-17: 10:00:00 via INTRAVENOUS
  Filled 2016-05-17: qty 1000

## 2016-05-17 MED ORDER — ZOLEDRONIC ACID 5 MG/100ML IV SOLN
5.0000 mg | Freq: Once | INTRAVENOUS | Status: AC
  Administered 2016-05-17: 5 mg via INTRAVENOUS
  Filled 2016-05-17: qty 100

## 2016-05-17 MED ORDER — ZOLEDRONIC ACID 4 MG/5ML IV CONC: 5.0000 mg | Freq: Once | INTRAVENOUS | Status: DC

## 2016-05-21 NOTE — Progress Notes (Signed)
SUBJECTIVE Patient  presents to office today complaining of elongated, thickened nails. Pain while ambulating in shoes. Patient is unable to trim their own nails.  Patient also complains of painful bunion deformity to the right foot. Patient states that he's had the bunion there for several years now and conservative modalities including shoe gear modifications padding have not helped. The patient is very active and enjoys walking and it is limiting her ability to perform her daily activities. Patient presents today for further treatment and evaluation  OBJECTIVE General Patient is awake, alert, and oriented x 3 and in no acute distress. Derm Skin is dry and supple bilateral. Negative open lesions or macerations. Remaining integument unremarkable. Nails are tender, long, thickened and dystrophic with subungual debris, consistent with onychomycosis, 1-5 bilateral. No signs of infection noted. Vasc  DP and PT pedal pulses palpable bilaterally. Temperature gradient within normal limits.  Neuro Epicritic and protective threshold sensation diminished bilaterally.  Musculoskeletal Exam clinical evidence of bunion deformity noted to the right foot. Pain is noted on palpation and range of motion. No symptomatic pedal deformities noted bilateral. Muscular strength within normal limits.  ASSESSMENT 1. Onychodystrophic nails 1-5 bilateral with hyperkeratosis of nails.  2. Onychomycosis of nail due to dermatophyte bilateral 3. Pain in foot bilateral 4. Hallux abductovalgus deformity right foot  PLAN OF CARE 1. Patient evaluated today.  2. Instructed to maintain good pedal hygiene and foot care.  3. Mechanical debridement of nails 1-5 bilaterally performed using a nail nipper. Filed with dremel without incident.  4. Today we discussed the conservative versus surgical management of hallux abductovalgus deformity. Today the patient. For conservative management however she will set her the surgical option for  the future.  5. Return to clinic in 3 mos.   Surgery authorization paperwork on next visit - bunionectomy with first metatarsal osteotomy right foot X-rays next visit   Edrick Kins, DPM [facility]  Dr. Edrick Kins, [address]                                        [place], [place] [address]                Office [phone]  Fax [phone]

## 2016-06-15 ENCOUNTER — Emergency Department
Admission: EM | Admit: 2016-06-15 | Discharge: 2016-06-15 | Disposition: A | Discharge status: Home / Self Care | Payer: Medicare Other | Attending: Emergency Medicine | Admitting: Emergency Medicine

## 2016-06-15 ENCOUNTER — Emergency Department: Payer: Medicare Other

## 2016-06-15 ENCOUNTER — Encounter: Payer: Self-pay | Admitting: *Deleted

## 2016-06-15 DIAGNOSIS — R0789 Other chest pain: Secondary | ICD-10-CM | POA: Diagnosis not present

## 2016-06-15 DIAGNOSIS — Z79899 Other long term (current) drug therapy: Secondary | ICD-10-CM | POA: Diagnosis not present

## 2016-06-15 DIAGNOSIS — I10 Essential (primary) hypertension: Secondary | ICD-10-CM | POA: Insufficient documentation

## 2016-06-15 DIAGNOSIS — F17210 Nicotine dependence, cigarettes, uncomplicated: Secondary | ICD-10-CM | POA: Insufficient documentation

## 2016-06-15 DIAGNOSIS — Z853 Personal history of malignant neoplasm of breast: Secondary | ICD-10-CM | POA: Diagnosis not present

## 2016-06-15 DIAGNOSIS — R0602 Shortness of breath: Secondary | ICD-10-CM | POA: Insufficient documentation

## 2016-06-15 DIAGNOSIS — R079 Chest pain, unspecified: Secondary | ICD-10-CM

## 2016-06-15 LAB — URINALYSIS, COMPLETE (UACMP) WITH MICROSCOPIC
BILIRUBIN URINE: NEGATIVE
Bacteria, UA: NONE SEEN
GLUCOSE, UA: NEGATIVE mg/dL
Hgb urine dipstick: NEGATIVE
KETONES UR: NEGATIVE mg/dL
LEUKOCYTES UA: NEGATIVE
Nitrite: NEGATIVE
PH: 5 (ref 5.0–8.0)
PROTEIN: NEGATIVE mg/dL
Specific Gravity, Urine: 1.024 (ref 1.005–1.030)

## 2016-06-15 LAB — BASIC METABOLIC PANEL
ANION GAP: 7 (ref 5–15)
BUN: 14 mg/dL (ref 6–20)
CHLORIDE: 107 mmol/L (ref 101–111)
CO2: 23 mmol/L (ref 22–32)
Calcium: 9.9 mg/dL (ref 8.9–10.3)
Creatinine, Ser: 0.83 mg/dL (ref 0.44–1.00)
GFR calc non Af Amer: >60 mL/min (ref >60)
Glucose, Bld: 141 mg/dL — ABNORMAL HIGH (ref 65–99)
Potassium: 3.8 mmol/L (ref 3.5–5.1)
SODIUM: 137 mmol/L (ref 135–145)

## 2016-06-15 LAB — CBC
HCT: 38.5 % (ref 35.0–47.0)
Hemoglobin: 13.1 g/dL (ref 12.0–16.0)
MCH: 30.3 pg (ref 26.0–34.0)
MCHC: 33.9 g/dL (ref 32.0–36.0)
MCV: 89.2 fL (ref 80.0–100.0)
Platelets: 281 10*3/uL (ref 150–440)
RBC: 4.31 MIL/uL (ref 3.80–5.20)
RDW: 14.3 % (ref 11.5–14.5)
WBC: 5 10*3/uL (ref 3.6–11.0)

## 2016-06-15 LAB — TROPONIN I: Troponin I: <0.03 ng/mL (ref <0.03)

## 2016-06-15 LAB — BRAIN NATRIURETIC PEPTIDE: B Natriuretic Peptide: 9 pg/mL (ref 0.0–100.0)

## 2016-06-15 MED ORDER — IOPAMIDOL (ISOVUE-370) INJECTION 76%
75.0000 mL | Freq: Once | INTRAVENOUS | Status: AC | PRN
Start: 2016-06-15 — End: 2016-06-15
  Administered 2016-06-15: 75 mL via INTRAVENOUS

## 2016-06-15 NOTE — ED Triage Notes (Signed)
States SOB that began a week or so ago, states she started taking requiest and thought it was a side effect but states it never got better, also states increased in urination and lower left back pain, pt awake and alert in no acute distress

## 2016-06-15 NOTE — ED Notes (Signed)
Patient is leaving for CT scan. Patient is upset that she couldn't get a sandwich earlier. Patient refused peanut butter and crackers. Patient states there is nothing that this writer could do to make her feel better. Patient states she is hungry and will get something when she is discharged, but that the hunger is making her more weak. Patient also states she is upset that she had to cancel her plane reservation tomorrow to visit her family in [place].

## 2016-06-15 NOTE — Discharge Instructions (Signed)

## 2016-06-15 NOTE — ED Provider Notes (Signed)
[facility] Emergency Department Provider Note  ____________________________________________  Time seen: Approximately 12:31 PM  I have reviewed the triage vital signs and the nursing notes.   HISTORY  Chief Complaint Shortness of Breath   HPI Edit Alexis Arias is a 75 y.o. female with a history of breast cancer status post mastectomy and tamoxifen therapy currently in remission, hypertension, hyperlipidemia, and osteoporosis on reclast who presents for evaluation of shortness of breath. Patient reports that she received a infusion of reclast 3 weeks ago and since then she started having shortness of breath. She reports that the shortness of breath is worse with exertion and much worse over the course of last 3 days. She reports that she is now unable to climb up 17 steps to her apartment without feeling short of breath. She also reports chest tightness when she exerts herself the resolves at rest. She has had a dry cough for the last 3 weeks but no fever or chills. She reports that she felt the same symptoms 2 years ago when she received an infusion of reclast. She came in today because she is concerned that she is about to travel tomorrow for Christmas out-of-state and since her symptoms were not getting better she wanted to make sure there wasn't anything else going on as she does not want to be hospitalized out of state. She denies fever or chills, she has no shortness of breath at rest, no orthopnea, no chest pain at this time, no abdominal pain, no nausea or vomiting. She does endorse weight gain however she doesn't know how many pounds. She's never been on diuretics before. She has had a stress test and echocardiogram in November 2017 which were both normal. She is not a smoker area no personal or family history of ischemic heart disease.  Past Medical History:  Diagnosis Date  . Acute vaginitis   . Breast cancer ([place]) 2001   right breast-followed by modified radical  mastectomy and chemotherapy  . Breast cancer ([place]) 1992   left-post mastectomy and "hormonal manipulation"  . Constipation, chronic 07/2013  . Hepatic steatosis   . History of chemotherapy 2001  . Hyperlipidemia 10/2014  . Hypertension   . Insomnia   . Neuropathy ([place])   . Osteoporosis 2012   tx with reclast    Patient Active Problem List   Diagnosis Date Noted  . Carcinoma of overlapping sites of right breast in female, estrogen receptor positive ([place]) 05/12/2016  . Age-related osteoporosis without current pathological fracture 05/12/2016  . Personal history of healed traumatic fracture 08/26/2015  . Pancreatic mass 06/07/2015  . Chronic LBP 05/28/2015  . Gastro-esophageal reflux disease without esophagitis 05/28/2015  . Hypertensive heart disease without CHF 05/28/2015  . Dizziness 04/30/2015  . Breast cancer in female [facility]) 04/29/2015  . Combined fat and carbohydrate induced hyperlipemia 04/29/2015  . Degenerative arthritis of lumbar spine 04/29/2015  . Osteopenia 04/29/2015  . Malignant neoplasm of female breast ([place]) 04/08/2015  . Insomnia, persistent 04/08/2015  . Essential (primary) hypertension 04/08/2015  . OP (osteoporosis) 04/08/2015    Past Surgical History:  Procedure Laterality Date  . BREAST RECONSTRUCTION Bilateral   . BREAST SURGERY    . COLONOSCOPY  2012  . [place] OF UTERUS  2016  . HYSTEROSCOPY W/D&C N/A 11/26/2014   Procedure: DILATATION AND CURETTAGE /HYSTEROSCOPY;  Surgeon: Honor Loh Ward, MD;  Location: [facility] ORS;  Service: Gynecology;  Laterality: N/A;  . ovaries removed    . UPPER GI  ENDOSCOPY  07/2013    Prior to Admission medications   Medication Sig Start Date End Date Taking? Authorizing Provider  acetaminophen (TYLENOL) 325 MG tablet Take by mouth.   Yes Historical Provider, MD  losartan-hydrochlorothiazide (HYZAAR) 100-12.5 MG per tablet Take 1 tablet by mouth every morning.    Yes Historical Provider, MD  metroNIDAZOLE  (METROGEL) 0.75 % vaginal gel Place A999333 Applicatorfuls vaginally as needed. 04/23/15  Yes Historical Provider, MD  Multiple Vitamin (MULTI-VITAMINS) TABS Take by mouth.   Yes Historical Provider, MD  QUEtiapine (SEROQUEL) 50 MG tablet Take 50 mg by mouth at bedtime.    Yes Historical Provider, MD  Vitamin D, Ergocalciferol, (DRISDOL) 50000 units CAPS capsule Take 50,000 Units by mouth every 7 (seven) days.   Yes Historical Provider, MD  dicyclomine (BENTYL) 20 MG tablet Take 1 tablet (20 mg total) by mouth 3 (three) times daily as needed for spasms. Patient not taking: Reported on 05/12/2016 06/06/15 06/05/16  Nance Pear, MD  ezetimibe (ZETIA) 10 MG tablet Take by mouth daily.  05/04/16 05/04/17  Historical Provider, MD  polyethylene glycol (MIRALAX / GLYCOLAX) packet Take 17 g by mouth daily. Patient not taking: Reported on 06/15/2016 06/06/15   Nance Pear, MD    Allergies Penicillin g; Cymbalta [duloxetine hcl]; and Lipitor [atorvastatin]  Family History  Problem Relation Age of Onset  . Heart disease Mother   . Alzheimer's disease Father     Social History Social History  Substance Use Topics  . Smoking status: Current Some Day Smoker    Types: Cigarettes  . Smokeless tobacco: Never Used     Comment: 1 cigarette a day  . Alcohol use No    Review of Systems  Constitutional: Negative for fever. Eyes: Negative for visual changes. ENT: Negative for sore throat. Neck: No neck pain  Cardiovascular: + chest pain. Respiratory: + shortness of breath, cough Gastrointestinal: Negative for abdominal pain, vomiting or diarrhea. Genitourinary: Negative for dysuria. Musculoskeletal: Negative for back pain. Skin: Negative for rash. Neurological: Negative for headaches, weakness or numbness. Psych: No SI or HI  ____________________________________________   PHYSICAL EXAM:  VITAL SIGNS: ED Triage Vitals  Enc Vitals Group     BP 06/15/16 1000 (!) 150/76     Pulse Rate  06/15/16 1000 91     Resp 06/15/16 1000 18     Temp 06/15/16 1000 98.4 F (36.9 C)     Temp Source 06/15/16 1000 Oral     SpO2 06/15/16 1000 98 %     Weight 06/15/16 0958 205 lb (93 kg)     Height 06/15/16 0958 5\' 8"  (1.727 m)     Head Circumference --      Peak Flow --      Pain Score 06/15/16 0958 3     Pain Loc --      Pain Edu? --      Excl. in [place]? --     Constitutional: Alert and oriented. Well appearing and in no apparent distress. HEENT:      Head: Normocephalic and atraumatic.         Eyes: Conjunctivae are normal. Sclera is non-icteric. EOMI. PERRL      Mouth/Throat: Mucous membranes are moist.       Neck: Supple with no signs of meningismus. Cardiovascular: Regular rate and rhythm. No murmurs, gallops, or rubs. 2+ symmetrical distal pulses are present in all extremities. No JVD. Respiratory: Normal respiratory effort. Lungs are clear to auscultation bilaterally. No wheezes, crackles, or  rhonchi.  Gastrointestinal: Soft, non tender, and non distended with positive bowel sounds. No rebound or guarding. Genitourinary: No CVA tenderness. Musculoskeletal: Nontender with normal range of motion in all extremities. No edema, cyanosis, or erythema of extremities. Neurologic: Normal speech and language. Face is symmetric. Moving all extremities. No gross focal neurologic deficits are appreciated. Skin: Skin is warm, dry and intact. No rash noted. Psychiatric: Mood and affect are normal. Speech and behavior are normal.  ____________________________________________   LABS (all labs ordered are listed, but only abnormal results are displayed)  Labs Reviewed  BASIC METABOLIC PANEL - Abnormal; Notable for the following:       Result Value   Glucose, Bld 141 (*)    All other components within normal limits  URINALYSIS, COMPLETE (UACMP) WITH MICROSCOPIC - Abnormal; Notable for the following:    Color, Urine YELLOW (*)    APPearance CLEAR (*)    Squamous Epithelial / LPF 0-5 (*)      All other components within normal limits  CBC  BRAIN NATRIURETIC PEPTIDE  TROPONIN I  TROPONIN I   ____________________________________________  EKG  ED ECG REPORT I, Rudene Re, the attending physician, personally viewed and interpreted this ECG.  Normal sinus rhythm, rate of 90, LVH, normal intervals, normal axis, no ST elevations or depressions. Unchanged from prior ____________________________________________  RADIOLOGY  CXR:  Mild interstitial edema, likely indicative of a degree of congestive heart failure. No airspace consolidation. Aorta tortuous. ____________________________________________   PROCEDURES  Procedure(s) performed: None Procedures Critical Care performed:  None ____________________________________________   INITIAL IMPRESSION / ASSESSMENT AND PLAN / ED COURSE  75 y.o. female with a history of breast cancer status post mastectomy and tamoxifen therapy currently in remission, hypertension, hyperlipidemia, and osteoporosis on reclast who presents for evaluation of worsening dyspnea on exertion associated with chest tightness for the last 3 weeks. Patient also endorses weight gain. Patient is well-appearing, in no distress, her vital signs are within normal limits, her physical exam shows no pitting edema, lungs are clear to auscultation, her EKG shows no evidence of ischemia and is unchanged from prior. Chest x-ray is concerning for mild interstitial edema however patient has normal BNP and no signs on exam of heart failure. She also has had a normal echo done in September 2017. I have looked up the side effect profile of reclast which is associated with dyspnea on 22-25% of all patient's and since patient has had similar episode 2 years ago when she received this medication I do believe at this time this is most likely a medication side effect. We'll get a second set of cardiac enzymes and if that's negative and patient remains asymptomatic in the  emergency room we'll discharge her home with close follow-up with her cardiologist  Clinical Course as of Jun 15 1628  Thu Jun 15, 2016  1247 I discussed patient's presentation, work up, labs, and exam with DR. Nehemiah Massed who is on call for Dr. Ubaldo Glassing patient's cardiologist. He agrees with dc home if second troponin is negative and Dr. Ubaldo Glassing will ser in the clinic tomorrow at 10:30 AM for close follow up.  [CV]  1627 Troponin 2 is negative. CT with no evidence of PE, dissection, pulmonary edema. CT did show Mild perihilar peribronchial and perivascular thickening. Patient will f/u with PCP in 2 days and Dr. Ubaldo Glassing tomorrow.   [CV]    Clinical Course User Index [CV] Rudene Re, MD    Pertinent labs & imaging results that were available during my  care of the patient were reviewed by me and considered in my medical decision making (see chart for details).    ____________________________________________   FINAL CLINICAL IMPRESSION(S) / ED DIAGNOSES  Final diagnoses:  SOB (shortness of breath)  Chest pain, unspecified type      NEW MEDICATIONS STARTED DURING THIS VISIT:  New Prescriptions   No medications on file     Note:  This document was prepared using Dragon voice recognition software and may include unintentional dictation errors.    Rudene Re, MD 06/15/16 1630

## 2016-06-27 ENCOUNTER — Encounter: Payer: Self-pay | Admitting: *Deleted

## 2016-07-11 ENCOUNTER — Ambulatory Visit (INDEPENDENT_AMBULATORY_CARE_PROVIDER_SITE_OTHER): Payer: Medicare Other | Admitting: General Surgery

## 2016-07-11 ENCOUNTER — Encounter: Payer: Self-pay | Admitting: General Surgery

## 2016-07-11 VITALS — BP 126/72 | HR 74 | Resp 16 | Ht 68.0 in | Wt 218.0 lb

## 2016-07-11 DIAGNOSIS — R0789 Other chest pain: Secondary | ICD-10-CM | POA: Diagnosis not present

## 2016-07-11 NOTE — Progress Notes (Signed)
Patient ID: Alexis Arias, female   DOB: 1940-12-18, 76 y.o.   MRN: [id number]  Chief Complaint  Patient presents with  . Breast Problem    HPI Alexis Arias is a 76 y.o. female.  who presents for a breast evaluation. The last mammogram was done in [id number] after her silicone implants were changed to saline, she instead had chest x rays.  Patient does perform regular self breast checks. She is complaining of bilateral lateral breast pain and central breast bone area since November. She states it is sharpe pain that comes and goes on a daily basis. She does feel it is some muscle pain but feels she wants the implants removed.  She does feel the "fluid pill" from Dr Ubaldo Glassing has helped the feeling in her central chest.  HPI  Past Medical History:  Diagnosis Date  . Acute vaginitis   . Breast cancer ([place]) 2001   right breast-followed by modified radical mastectomy and chemotherapy  . Breast cancer ([place]) 1992   left-post mastectomy and "hormonal manipulation"  . Colon polyp 2012  . Constipation, chronic 07/2013  . Fractured pelvis ([place]) 01/2016  . Hepatic steatosis   . History of chemotherapy 2001  . Hyperlipidemia 10/2014  . Hypertension   . Insomnia   . Neuropathy ([place])   . Osteoporosis 2012   tx with reclast    Past Surgical History:  Procedure Laterality Date  . BREAST RECONSTRUCTION Bilateral 2003  . BREAST REDUCTION SURGERY  1980's  . BREAST SURGERY Bilateral 2007   Dr Christin Bach [place] saline implants  . COLONOSCOPY  2012  . [place] OF UTERUS  2016  . HYSTEROSCOPY W/D&C N/A 11/26/2014   Procedure: DILATATION AND CURETTAGE /HYSTEROSCOPY;  Surgeon: Honor Loh Ward, MD;  Location: [facility] ORS;  Service: Gynecology;  Laterality: N/A;  . MASTECTOMY Left 1992   Dr Elenore Rota Brief in [place]   . MASTECTOMY Right 2001   Dr Rulon Sera in [place] wtih reconstruction (silicone)  . ovaries removed    . tummy tuck  1980's  . UPPER GI ENDOSCOPY  07/2013    Family History   Problem Relation Age of Onset  . Heart disease Mother   . Alzheimer's disease Father     Social History Social History  Substance Use Topics  . Smoking status: Former Smoker    Types: Cigarettes    Quit date: 06/27/2011  . Smokeless tobacco: Never Used     Comment: 1 cigarette a day  . Alcohol use 0.0 oz/week    Allergies  Allergen Reactions  . Penicillin G     Other reaction(s): Dizziness, passed out  . Cymbalta [Duloxetine Hcl] Rash    Sensitive to light,   . Lipitor [Atorvastatin] Rash    Memory loss also    Current Outpatient Prescriptions  Medication Sig Dispense Refill  . acetaminophen (TYLENOL) 325 MG tablet Take by mouth.    . CHROMIUM PO Take by mouth daily.    Marland Kitchen ezetimibe (ZETIA) 10 MG tablet Take by mouth daily.     . furosemide (LASIX) 20 MG tablet Take 20 mg by mouth daily.    Marland Kitchen losartan-hydrochlorothiazide (HYZAAR) 100-12.5 MG per tablet Take 1 tablet by mouth every morning.     . metroNIDAZOLE (METROGEL) 0.75 % vaginal gel Place [id number] Applicatorfuls vaginally as needed.    . Multiple Vitamin (MULTI-VITAMINS) TABS Take by mouth.    . QUEtiapine (SEROQUEL) 50 MG tablet Take 50 mg by mouth at bedtime.     Marland Kitchen  Vitamin D, Ergocalciferol, (DRISDOL) 50000 units CAPS capsule Take 50,000 Units by mouth every 7 (seven) days.    Marland Kitchen dicyclomine (BENTYL) 20 MG tablet Take 1 tablet (20 mg total) by mouth 3 (three) times daily as needed for spasms. (Patient not taking: Reported on 05/12/2016) 30 tablet 0   No current facility-administered medications for this visit.     Review of Systems Review of Systems  Constitutional: Negative.   Respiratory: Negative.   Cardiovascular: Negative.     Blood pressure 126/72, pulse 74, resp. rate 16, height 5\' 8"  (1.727 m), weight 218 lb (98.9 kg).  Physical Exam Physical Exam  Constitutional: She is oriented to person, place, and time. She appears well-developed and well-nourished.  HENT:  Mouth/Throat: Oropharynx is clear and  moist.  Eyes: Conjunctivae are normal. No scleral icterus.  Neck: Neck supple.  Cardiovascular: Normal rate, regular rhythm and normal heart sounds.   Pulmonary/Chest: Effort normal and breath sounds normal. Right breast exhibits tenderness. Right breast exhibits no inverted nipple, no mass, no nipple discharge and no skin change. Left breast exhibits tenderness. Left breast exhibits no inverted nipple, no mass, no nipple discharge and no skin change.    Tender along left > right latissimus muscle and pectoralis. Tattoo nipple on the left  Lymphadenopathy:    She has no cervical adenopathy.    She has no axillary adenopathy.  Neurological: She is alert and oriented to person, place, and time.  Skin: Skin is warm and dry.  Psychiatric: Her behavior is normal.      Assessment    No evidence of recurrent breast cancer, chest wall pain from musculoskeletal source.    Plan    No intervention required.    The patient is aware to call back for any questions or concerns. Follow up as needed.  This information has been scribed by Karie Fetch RN, BSN,BC.   Robert Bellow 07/12/2016, 2:07 PM

## 2016-07-11 NOTE — Patient Instructions (Signed)
The patient is aware to call back for any questions or concerns.  

## 2016-07-12 DIAGNOSIS — R0789 Other chest pain: Secondary | ICD-10-CM | POA: Insufficient documentation

## 2016-08-14 ENCOUNTER — Ambulatory Visit: Payer: Medicare Other | Admitting: Podiatry

## 2016-09-04 ENCOUNTER — Encounter: Payer: Self-pay | Admitting: Podiatry

## 2016-09-04 ENCOUNTER — Ambulatory Visit (INDEPENDENT_AMBULATORY_CARE_PROVIDER_SITE_OTHER): Payer: Medicare Other | Admitting: Podiatry

## 2016-09-04 DIAGNOSIS — M79676 Pain in unspecified toe(s): Secondary | ICD-10-CM | POA: Diagnosis not present

## 2016-09-04 DIAGNOSIS — B351 Tinea unguium: Secondary | ICD-10-CM

## 2016-09-04 DIAGNOSIS — M21611 Bunion of right foot: Secondary | ICD-10-CM

## 2016-09-04 DIAGNOSIS — M2011 Hallux valgus (acquired), right foot: Secondary | ICD-10-CM

## 2016-09-04 DIAGNOSIS — M79609 Pain in unspecified limb: Principal | ICD-10-CM

## 2016-09-04 NOTE — Progress Notes (Signed)
Complaint:  Visit Type: Patient returns to my office for continued preventative foot care services. Complaint: Patient states" my nails have grown long and thick and become painful to walk and wear shoes" . The patient presents for preventative foot care services. No changes to ROS  Podiatric Exam: Vascular: dorsalis pedis and posterior tibial pulses are palpable bilateral. Capillary return is immediate. Temperature gradient is WNL. Skin turgor WNL  Sensorium: Normal Semmes Weinstein monofilament test. Normal tactile sensation bilaterally. Nail Exam: Pt has thick disfigured discolored nails with subungual debris noted bilateral entire nail hallux through fifth toenails Ulcer Exam: There is no evidence of ulcer or pre-ulcerative changes or infection. Orthopedic Exam: Muscle tone and strength are WNL. No limitations in general ROM. No crepitus or effusions noted. Foot type and digits show no abnormalities. Bony prominences are unremarkable. Skin: No Porokeratosis. No infection or ulcers  Diagnosis:  Onychomycosis, , Pain in right toe, pain in left toes  Treatment & Plan Procedures and Treatment: Consent by patient was obtained for treatment procedures. The patient understood the discussion of treatment and procedures well. All questions were answered thoroughly reviewed. Debridement of mycotic and hypertrophic toenails, 1 through 5 bilateral and clearing of subungual debris. No ulceration, no infection noted.  Return Visit-Office Procedure: Patient instructed to return to the office for a follow up visit 3 months   for continued evaluation and treatment.    Teresea Donley DPM 

## 2016-09-16 ENCOUNTER — Emergency Department: Payer: Medicare Other

## 2016-09-16 ENCOUNTER — Emergency Department
Admission: EM | Admit: 2016-09-16 | Discharge: 2016-09-16 | Disposition: A | Discharge status: Home / Self Care | Payer: Medicare Other | Attending: Emergency Medicine | Admitting: Emergency Medicine

## 2016-09-16 DIAGNOSIS — Z79899 Other long term (current) drug therapy: Secondary | ICD-10-CM | POA: Diagnosis not present

## 2016-09-16 DIAGNOSIS — Z87891 Personal history of nicotine dependence: Secondary | ICD-10-CM | POA: Diagnosis not present

## 2016-09-16 DIAGNOSIS — R197 Diarrhea, unspecified: Secondary | ICD-10-CM | POA: Diagnosis not present

## 2016-09-16 DIAGNOSIS — R1031 Right lower quadrant pain: Secondary | ICD-10-CM | POA: Insufficient documentation

## 2016-09-16 DIAGNOSIS — R109 Unspecified abdominal pain: Secondary | ICD-10-CM

## 2016-09-16 DIAGNOSIS — I119 Hypertensive heart disease without heart failure: Secondary | ICD-10-CM | POA: Insufficient documentation

## 2016-09-16 DIAGNOSIS — Z853 Personal history of malignant neoplasm of breast: Secondary | ICD-10-CM | POA: Insufficient documentation

## 2016-09-16 DIAGNOSIS — R1032 Left lower quadrant pain: Secondary | ICD-10-CM | POA: Diagnosis not present

## 2016-09-16 LAB — COMPREHENSIVE METABOLIC PANEL
ALBUMIN: 4.1 g/dL (ref 3.5–5.0)
ALT: 13 U/L — ABNORMAL LOW (ref 14–54)
AST: 20 U/L (ref 15–41)
Alkaline Phosphatase: 43 U/L (ref 38–126)
Anion gap: 7 (ref 5–15)
BILIRUBIN TOTAL: 0.9 mg/dL (ref 0.3–1.2)
BUN: 15 mg/dL (ref 6–20)
CALCIUM: 10.1 mg/dL (ref 8.9–10.3)
CO2: 27 mmol/L (ref 22–32)
Chloride: 103 mmol/L (ref 101–111)
Creatinine, Ser: 0.79 mg/dL (ref 0.44–1.00)
GFR calc Af Amer: >60 mL/min (ref >60)
GFR calc non Af Amer: >60 mL/min (ref >60)
GLUCOSE: 125 mg/dL — AB (ref 65–99)
POTASSIUM: 3.8 mmol/L (ref 3.5–5.1)
SODIUM: 137 mmol/L (ref 135–145)
TOTAL PROTEIN: 9.1 g/dL — AB (ref 6.5–8.1)

## 2016-09-16 LAB — CBC WITH DIFFERENTIAL/PLATELET
BASOS ABS: 0.1 10*3/uL (ref 0–0.1)
BASOS PCT: 1 %
Eosinophils Absolute: 0.1 10*3/uL (ref 0–0.7)
Eosinophils Relative: 3 %
HEMATOCRIT: 37.1 % (ref 35.0–47.0)
HEMOGLOBIN: 12.8 g/dL (ref 12.0–16.0)
Lymphocytes Relative: 46 %
Lymphs Abs: 2.4 10*3/uL (ref 1.0–3.6)
MCH: 30.7 pg (ref 26.0–34.0)
MCHC: 34.4 g/dL (ref 32.0–36.0)
MCV: 89.2 fL (ref 80.0–100.0)
Monocytes Absolute: 0.6 10*3/uL (ref 0.2–0.9)
Monocytes Relative: 13 %
NEUTROS ABS: 1.9 10*3/uL (ref 1.4–6.5)
NEUTROS PCT: 37 %
Platelets: 250 10*3/uL (ref 150–440)
RBC: 4.16 MIL/uL (ref 3.80–5.20)
RDW: 14.5 % (ref 11.5–14.5)
WBC: 5.1 10*3/uL (ref 3.6–11.0)

## 2016-09-16 LAB — LIPASE, BLOOD: Lipase: 12 U/L (ref 11–51)

## 2016-09-16 MED ORDER — DICYCLOMINE HCL 20 MG PO TABS: 20.0000 mg | ORAL_TABLET | Freq: Three times a day (TID) | ORAL | 0 refills | Status: DC | PRN

## 2016-09-16 MED ORDER — IOPAMIDOL (ISOVUE-300) INJECTION 61%
100.0000 mL | Freq: Once | INTRAVENOUS | Status: AC | PRN
  Administered 2016-09-16: 100 mL via INTRAVENOUS

## 2016-09-16 MED ORDER — IOPAMIDOL (ISOVUE-300) INJECTION 61%
30.0000 mL | Freq: Once | INTRAVENOUS | Status: AC | PRN
  Administered 2016-09-16: 30 mL via ORAL

## 2016-09-16 MED ORDER — MORPHINE SULFATE (PF) 2 MG/ML IV SOLN
2.0000 mg | Freq: Once | INTRAVENOUS | Status: AC
  Administered 2016-09-16: 2 mg via INTRAVENOUS
  Filled 2016-09-16: qty 1

## 2016-09-16 MED ORDER — ONDANSETRON HCL 4 MG/2ML IJ SOLN
4.0000 mg | Freq: Once | INTRAMUSCULAR | Status: AC
  Administered 2016-09-16: 4 mg via INTRAVENOUS
  Filled 2016-09-16: qty 2

## 2016-09-16 MED ORDER — ACETAMINOPHEN 500 MG PO TABS
1000.0000 mg | ORAL_TABLET | Freq: Once | ORAL | Status: AC
  Administered 2016-09-16: 1000 mg via ORAL
  Filled 2016-09-16: qty 2

## 2016-09-16 MED ORDER — DICYCLOMINE HCL 10 MG PO CAPS
10.0000 mg | ORAL_CAPSULE | Freq: Once | ORAL | Status: AC
  Administered 2016-09-16: 10 mg via ORAL
  Filled 2016-09-16: qty 1

## 2016-09-16 NOTE — ED Provider Notes (Signed)
[facility] Emergency Department Provider Note    First MD Initiated Contact with Patient 09/16/16 [phone]     (approximate)  I have reviewed the triage vital signs and the nursing notes.   HISTORY  Chief Complaint Abdominal Pain   HPI Alexis Arias is a 76 y.o. female with below list of chronic medical conditions presents to the emergency department withbilateral lower quadrant abdominal pain 3 days associated with diarrhea which is since resolved. Patient states pain progressively worsening over the course of tonight which resulted in her calling EMS. Patient states current pain score is 7 out of 10. Patient denies any aggravating or alleviating factors.   Past Medical History:  Diagnosis Date  . Acute vaginitis   . Breast cancer ([place]) 2001   right breast-followed by modified radical mastectomy and chemotherapy  . Breast cancer ([place]) 1992   left-post mastectomy and "hormonal manipulation"  . Colon polyp 2012  . Constipation, chronic 07/2013  . Fractured pelvis ([place]) 01/2016  . Hepatic steatosis   . History of chemotherapy 2001  . Hyperlipidemia 10/2014  . Hypertension   . Insomnia   . Neuropathy ([place])   . Osteoporosis 2012   tx with reclast    Patient Active Problem List   Diagnosis Date Noted  . Chest wall pain 07/12/2016  . Carcinoma of overlapping sites of right breast in female, estrogen receptor positive ([place]) 05/12/2016  . Age-related osteoporosis without current pathological fracture 05/12/2016  . Personal history of healed traumatic fracture 08/26/2015  . Pancreatic mass 06/07/2015  . Chronic LBP 05/28/2015  . Gastro-esophageal reflux disease without esophagitis 05/28/2015  . Hypertensive heart disease without CHF 05/28/2015  . Dizziness 04/30/2015  . Breast cancer in female [facility]) 04/29/2015  . Combined fat and carbohydrate induced hyperlipemia 04/29/2015  . Degenerative arthritis of lumbar spine 04/29/2015  . Osteopenia 04/29/2015    . Malignant neoplasm of female breast ([place]) 04/08/2015  . Insomnia, persistent 04/08/2015  . Essential (primary) hypertension 04/08/2015  . OP (osteoporosis) 04/08/2015    Past Surgical History:  Procedure Laterality Date  . BREAST RECONSTRUCTION Bilateral 2003  . BREAST REDUCTION SURGERY  1980's  . BREAST SURGERY Bilateral 2007   Dr Christin Bach [place] saline implants  . COLONOSCOPY  2012  . [place] OF UTERUS  2016  . HYSTEROSCOPY W/D&C N/A 11/26/2014   Procedure: DILATATION AND CURETTAGE /HYSTEROSCOPY;  Surgeon: Honor Loh Ward, MD;  Location: [facility] ORS;  Service: Gynecology;  Laterality: N/A;  . MASTECTOMY Left 1992   Dr Elenore Rota Brief in [place]   . MASTECTOMY Right 2001   Dr Rulon Sera in [place] wtih reconstruction (silicone)  . ovaries removed    . tummy tuck  1980's  . UPPER GI ENDOSCOPY  07/2013    Prior to Admission medications   Medication Sig Start Date End Date Taking? Authorizing Provider  acetaminophen (TYLENOL) 325 MG tablet Take 650 mg by mouth every 6 (six) hours as needed for mild pain or headache.    Yes Historical Provider, MD  cholecalciferol (VITAMIN D) 1000 units tablet Take 1,000 Units by mouth daily.   Yes Historical Provider, MD  CHROMIUM PO Take 1 tablet by mouth daily.    Yes Historical Provider, MD  dicyclomine (BENTYL) 20 MG tablet Take 20 mg by mouth 3 (three) times daily as needed for spasms.   Yes Historical Provider, MD  ezetimibe (ZETIA) 10 MG tablet Take 10 mg by mouth daily.  05/04/16 05/04/17 Yes  Historical Provider, MD  furosemide (LASIX) 20 MG tablet Take 20 mg by mouth daily.   Yes Historical Provider, MD  losartan-hydrochlorothiazide (HYZAAR) 100-12.5 MG per tablet Take 1 tablet by mouth every morning.    Yes Historical Provider, MD  metroNIDAZOLE (METROGEL) 0.75 % vaginal gel Place 8.29 Applicatorfuls vaginally as needed. 04/23/15  Yes Historical Provider, MD  Multiple Vitamin (MULTI-VITAMINS) TABS Take 1 tablet by  mouth daily.    Yes Historical Provider, MD  QUEtiapine (SEROQUEL) 50 MG tablet Take 50 mg by mouth at bedtime.    Yes Historical Provider, MD  dicyclomine (BENTYL) 20 MG tablet Take 1 tablet (20 mg total) by mouth 3 (three) times daily as needed for spasms. 09/16/16 09/16/17  Gregor Hams, MD    Allergies Penicillin g; Cymbalta [duloxetine hcl]; and Lipitor [atorvastatin]  Family History  Problem Relation Age of Onset  . Heart disease Mother   . Alzheimer's disease Father     Social History Social History  Substance Use Topics  . Smoking status: Former Smoker    Types: Cigarettes    Quit date: 06/27/2011  . Smokeless tobacco: Never Used     Comment: 1 cigarette a day  . Alcohol use 0.0 oz/week    Review of Systems Constitutional: No fever/chills Eyes: No visual changes. ENT: No sore throat. Cardiovascular: Denies chest pain. Respiratory: Denies shortness of breath. Gastrointestinal:Positive for abdominal pain.  No nausea, no vomiting.  No diarrhea.  No constipation. Genitourinary: Negative for dysuria. Musculoskeletal: Negative for back pain. Skin: Negative for rash. Neurological: Negative for headaches, focal weakness or numbness.  10-point ROS otherwise negative.  ____________________________________________   PHYSICAL EXAM:  VITAL SIGNS: ED Triage Vitals  Enc Vitals Group     BP 09/16/16 0438 (!) 160/92     Pulse Rate 09/16/16 0432 80     Resp 09/16/16 0432 16     Temp 09/16/16 0432 98.1 F (36.7 C)     Temp Source 09/16/16 0432 Oral     SpO2 09/16/16 0432 96 %     Weight 09/16/16 0433 210 lb (95.3 kg)     Height 09/16/16 0433 5\' 8"  (1.727 m)     Head Circumference --      Peak Flow --      Pain Score 09/16/16 0434 9     Pain Loc --      Pain Edu? --      Excl. in [place]? --     Constitutional: Alert and oriented. Apparent discomfort Eyes: Conjunctivae are normal. PERRL. EOMI. Head: Atraumatic. Mouth/Throat: Mucous membranes are moist. Oropharynx  non-erythematous. Neck: No stridor.   Cardiovascular: Normal rate, regular rhythm. Good peripheral circulation. Grossly normal heart sounds. Respiratory: Normal respiratory effort.  No retractions. Lungs CTAB. Gastrointestinal: Left lower quadrant/right lower quadrant tenderness to palpation.. No distention.  Musculoskeletal: No lower extremity tenderness nor edema. No gross deformities of extremities. Neurologic:  Normal speech and language. No gross focal neurologic deficits are appreciated.  Skin:  Skin is warm, dry and intact. No rash noted. Psychiatric: Mood and affect are normal. Speech and behavior are normal.  ____________________________________________   LABS (all labs ordered are listed, but only abnormal results are displayed)  Labs Reviewed  COMPREHENSIVE METABOLIC PANEL - Abnormal; Notable for the following:       Result Value   Glucose, Bld 125 (*)    Total Protein 9.1 (*)    ALT 13 (*)    All other components within normal limits  CBC WITH  DIFFERENTIAL/PLATELET  LIPASE, BLOOD   ____________________________________________  EKG  ED ECG REPORT I, [place] N Royelle Hinchman, the attending physician, personally viewed and interpreted this ECG.   Date: 09/16/2016  EKG Time: 4:37 AM  Rate: 75  Rhythm: Normal sinus rhythm  Axis: Normal  Intervals: Normal  ST&T Change: None  ____________________________________________  RADIOLOGY I, [place] N Cortne Amara, personally viewed and evaluated these images (plain radiographs) as part of my medical decision making, as well as reviewing the written report by the radiologist.  CLINICAL DATA:  Lower abdominal pain with diarrhea  EXAM: CT ABDOMEN AND PELVIS WITH CONTRAST  TECHNIQUE: Multidetector CT imaging of the abdomen and pelvis was performed using the standard protocol following bolus administration of intravenous contrast.  CONTRAST:  156mL ISOVUE-300 IOPAMIDOL (ISOVUE-300) INJECTION 61%  COMPARISON:  CT abdomen pelvis  June 06, 2015; abdominal MRI June 14, 2015  FINDINGS: Lower chest: There is scarring with mild fibrosis in the lung bases. There is mild bibasilar atelectasis. No lung base pulmonary edema or consolidation. There are scattered foci of coronary artery calcification.  Hepatobiliary: No focal liver lesions are evident. Gallbladder wall is not appreciably thickened. There is no biliary duct dilatation.  Pancreas: There is no pancreatic mass or inflammatory focus.  Spleen: No splenic lesions are evident.  Adrenals/Urinary Tract: Adrenals appear normal bilaterally. There is a 9 x 8 mm cyst arising from the lateral mid right kidney. There is no hydronephrosis on either side. There is no renal or ureteral calculus on either side. Urinary bladder is midline with wall thickness within normal limits.  Stomach/Bowel: There is no appreciable bowel wall or mesenteric thickening. No bowel obstruction. No free air or portal venous air.  Vascular/Lymphatic: There is no abdominal aortic aneurysm. Major mesenteric vessels appear patent. There is a retroaortic left renal vein, an anatomic variant. There is no appreciable adenopathy in the abdomen or pelvis.  Reproductive: Uterus is anteverted. There is an apparent 2.1 x 1.6 cm mass arising from the posterolateral leftward aspect of the lower uterine segment, a likely small leiomyoma. No other pelvic masses evident.  Other: Appendix appears normal. There is no ascites or abscess in the abdomen or pelvis.  Musculoskeletal: There is degenerative change in the lower thoracic and lumbar spine regions. No blastic or lytic bone lesions are evident. No intramuscular or abdominal wall lesions. Breast implants are noted bilaterally.  IMPRESSION: No bowel wall thickening or bowel obstruction. No abscess. Appendix appears normal.  Probable small leiomyoma arising from the leftward lower uterine segment.  Fibrotic change in lung  bases with mild bibasilar bronchiectatic change.  Degenerative change in the lower thoracic and lumbar regions.  Foci of coronary artery calcification noted.   Electronically Signed   By: Lowella Grip III M.D.   On: 09/16/2016 07:26   Procedures   ____________________________________________   INITIAL IMPRESSION / ASSESSMENT AND PLAN / ED COURSE  Pertinent labs & imaging results that were available during my care of the patient were reviewed by me and considered in my medical decision making (see chart for details).  Patient given IV morphine and Zofran shortly after arrival to the treatment bed for analgesia and antiemetic respectively with improvement of both. Laboratory data unremarkable likewise CT scan of the abdomen pelvis revealed no acute abnormality. No clear etiology for the patient's bilateral lower quadrant abdominal pain identified as such she is referred to primary care provider for further outpatient evaluation and management.      ____________________________________________  FINAL CLINICAL IMPRESSION(S) / ED  DIAGNOSES  Final diagnoses:  Abdominal pain, unspecified abdominal location     MEDICATIONS GIVEN DURING THIS VISIT:  Medications  morphine 2 MG/ML injection 2 mg (2 mg Intravenous Given 09/16/16 0456)  ondansetron (ZOFRAN) injection 4 mg (4 mg Intravenous Given 09/16/16 0456)  iopamidol (ISOVUE-300) 61 % injection 30 mL (30 mLs Oral Contrast Given 09/16/16 0508)  morphine 2 MG/ML injection 2 mg (2 mg Intravenous Given 09/16/16 0555)  iopamidol (ISOVUE-300) 61 % injection 100 mL (100 mLs Intravenous Contrast Given 09/16/16 0643)  dicyclomine (BENTYL) capsule 10 mg (10 mg Oral Given 09/16/16 0826)  acetaminophen (TYLENOL) tablet 1,000 mg (1,000 mg Oral Given 09/16/16 0826)     NEW OUTPATIENT MEDICATIONS STARTED DURING THIS VISIT:  Discharge Medication List as of 09/16/2016  7:35 AM    START taking these medications   Details  !!  dicyclomine (BENTYL) 20 MG tablet Take 1 tablet (20 mg total) by mouth 3 (three) times daily as needed for spasms., Starting Sat 09/16/2016, Until Sun 09/16/2017, Print     !! - Potential duplicate medications found. Please discuss with provider.      Discharge Medication List as of 09/16/2016  7:35 AM      Discharge Medication List as of 09/16/2016  7:35 AM       Note:  This document was prepared using Dragon voice recognition software and may include unintentional dictation errors.    Gregor Hams, MD 09/18/16 2239

## 2016-09-16 NOTE — ED Notes (Signed)
Patient transported to CT 

## 2016-09-16 NOTE — ED Triage Notes (Signed)
Pt states diarrhea and bilateral lower quadrant pain since Thursday. Pt denies vomiting, nausea, fever. Pt with history of IBS.

## 2016-10-17 ENCOUNTER — Telehealth: Payer: Self-pay

## 2016-10-17 NOTE — Telephone Encounter (Signed)
Pt calling.  Was seen for vag d/c, tests were neg.  She is still having vag d/c.  Please ck record for anything transmittable.  She's not having sex but wants to be sure everything is okay.  Also wants to know if okay to still use the gel rx AMS gave her.  [phone]

## 2016-10-17 NOTE — Telephone Encounter (Signed)
Please advise 

## 2016-10-18 NOTE — Telephone Encounter (Signed)
Pt aware of AMS message and also if she is using the gel that could be what is causing the discharge, per AMS. Pt states she would like an appointment. Call transferred to [facility], pt aware if wanting first available it may not be with AMS. She voices an understanding.  KJ CMA

## 2016-10-18 NOTE — Telephone Encounter (Signed)
I don't think she was tested for an STD's she was seen for postmenopausal bleeding

## 2016-10-19 ENCOUNTER — Ambulatory Visit (INDEPENDENT_AMBULATORY_CARE_PROVIDER_SITE_OTHER): Payer: Medicare Other | Admitting: Obstetrics and Gynecology

## 2016-10-19 ENCOUNTER — Encounter: Payer: Self-pay | Admitting: Obstetrics and Gynecology

## 2016-10-19 VITALS — BP 140/90 | HR 76 | Ht 68.0 in | Wt 214.0 lb

## 2016-10-19 DIAGNOSIS — B9689 Other specified bacterial agents as the cause of diseases classified elsewhere: Secondary | ICD-10-CM | POA: Diagnosis not present

## 2016-10-19 DIAGNOSIS — N760 Acute vaginitis: Secondary | ICD-10-CM

## 2016-10-19 DIAGNOSIS — Z113 Encounter for screening for infections with a predominantly sexual mode of transmission: Secondary | ICD-10-CM

## 2016-10-19 DIAGNOSIS — N898 Other specified noninflammatory disorders of vagina: Secondary | ICD-10-CM

## 2016-10-19 LAB — POCT WET PREP WITH KOH
Clue Cells Wet Prep HPF POC: POSITIVE
KOH PREP POC: POSITIVE — AB
TRICHOMONAS UA: NEGATIVE
YEAST WET PREP PER HPF POC: NEGATIVE

## 2016-10-19 MED ORDER — CLINDAMYCIN PHOSPHATE 2 % VA CREA: 1.0000 | TOPICAL_CREAM | Freq: Every day | VAGINAL | 0 refills | Status: DC

## 2016-10-19 NOTE — Progress Notes (Signed)
Chief Complaint  Patient presents with  . std check    HPI:      Ms. Alexis Arias is a 76 y.o. G6P6 who LMP was No LMP recorded. Patient is postmenopausal., presents today for STD testing. She has a hx of recurrent increased vag d/c with irritation/ burning sx, no odor. Sx started 8/17 and pt treats with metrogel with temporary relief. She has never had STD testing and just wants to make sure that is not cause of sx.  She has not been sex active for the past 4 yrs (after the loss of her husband) but is planning to be.  She uses vagisil wash, scented wipes to clean, no dryer sheets.  The PMB sx from 8/17 resolved.   Review of Systems  Constitutional: Negative for fever.  Gastrointestinal: Positive for constipation. Negative for blood in stool, diarrhea, nausea and vomiting.  Genitourinary: Positive for vaginal discharge. Negative for dyspareunia, dysuria, flank pain, frequency, hematuria, urgency, vaginal bleeding and vaginal pain.  Musculoskeletal: Negative for back pain.  Skin: Positive for rash.    Patient Active Problem List   Diagnosis Date Noted  . Chest wall pain 07/12/2016  . Carcinoma of overlapping sites of right breast in female, estrogen receptor positive ([place]) 05/12/2016  . Age-related osteoporosis without current pathological fracture 05/12/2016  . Personal history of healed traumatic fracture 08/26/2015  . Pancreatic mass 06/07/2015  . Chronic LBP 05/28/2015  . Gastro-esophageal reflux disease without esophagitis 05/28/2015  . Hypertensive heart disease without CHF 05/28/2015  . Dizziness 04/30/2015  . Breast cancer in female [facility]) 04/29/2015  . Combined fat and carbohydrate induced hyperlipemia 04/29/2015  . Degenerative arthritis of lumbar spine 04/29/2015  . Osteopenia 04/29/2015  . Malignant neoplasm of female breast ([place]) 04/08/2015  . Insomnia, persistent 04/08/2015  . Essential (primary) hypertension 04/08/2015  . OP (osteoporosis) 04/08/2015    Family  History  Problem Relation Age of Onset  . Heart disease Mother   . Alzheimer's disease Father     Social History   Social History  . Marital status: Widowed    Spouse name: N/A  . Number of children: N/A  . Years of education: N/A   Occupational History  . Not on file.   Social History Main Topics  . Smoking status: Former Smoker    Types: Cigarettes    Quit date: 06/27/2011  . Smokeless tobacco: Never Used     Comment: 1 cigarette a day  . Alcohol use 0.0 oz/week  . Drug use: No  . Sexual activity: Yes    Birth control/ protection: Post-menopausal   Other Topics Concern  . Not on file   Social History Narrative  . No narrative on file     Current Outpatient Prescriptions:  .  losartan-hydrochlorothiazide (HYZAAR) 100-12.5 MG per tablet, Take 1 tablet by mouth every morning. , Disp: , Rfl:  .  Multiple Vitamin (MULTI-VITAMINS) TABS, Take 1 tablet by mouth daily. , Disp: , Rfl:  .  QUEtiapine (SEROQUEL) 50 MG tablet, Take 50 mg by mouth at bedtime. , Disp: , Rfl:  .  acetaminophen (TYLENOL) 325 MG tablet, Take 650 mg by mouth every 6 (six) hours as needed for mild pain or headache. , Disp: , Rfl:  .  cholecalciferol (VITAMIN D) 1000 units tablet, Take 1,000 Units by mouth daily., Disp: , Rfl:  .  CHROMIUM PO, Take 1 tablet by mouth daily. , Disp: , Rfl:  .  clindamycin (CLEOCIN) 2 % vaginal cream,  Place 1 Applicatorful vaginally at bedtime., Disp: 40 g, Rfl: 0 .  dicyclomine (BENTYL) 20 MG tablet, Take 20 mg by mouth 3 (three) times daily as needed for spasms., Disp: , Rfl:  .  dicyclomine (BENTYL) 20 MG tablet, Take 1 tablet (20 mg total) by mouth 3 (three) times daily as needed for spasms. (Patient not taking: Reported on 10/19/2016), Disp: 30 tablet, Rfl: 0 .  ezetimibe (ZETIA) 10 MG tablet, Take 10 mg by mouth daily. , Disp: , Rfl:  .  ezetimibe (ZETIA) 10 MG tablet, Take by mouth., Disp: , Rfl:  .  furosemide (LASIX) 20 MG tablet, Take 20 mg by mouth daily., Disp: ,  Rfl:  .  furosemide (LASIX) 20 MG tablet, Take by mouth., Disp: , Rfl:  .  lansoprazole (PREVACID) 30 MG capsule, Take by mouth., Disp: , Rfl:  .  lansoprazole (PREVACID) 30 MG capsule, , Disp: , Rfl:  .  losartan (COZAAR) 100 MG tablet, Take by mouth., Disp: , Rfl:  .  losartan-hydrochlorothiazide (HYZAAR) 100-12.5 MG tablet, TAKE 1 TABLET BY MOUTH EVERY DAY, Disp: , Rfl:  .  metroNIDAZOLE (METROGEL) 0.75 % vaginal gel, Place 8.24 Applicatorfuls vaginally as needed., Disp: , Rfl:  .  Multiple Vitamin (MULTI-VITAMINS) TABS, Take by mouth., Disp: , Rfl:  .  QUEtiapine (SEROQUEL) 50 MG tablet, TAKE 1 TABLET (50 MG TOTAL) BY MOUTH NIGHTLY., Disp: , Rfl:   OBJECTIVE:   Vitals:  BP 140/90   Pulse 76   Ht 5\' 8"  (1.727 m)   Wt 214 lb (97.1 kg)   BMI 32.54 kg/m   Physical Exam  Constitutional: She is oriented to person, place, and time and well-developed, well-nourished, and in no distress. Vital signs are normal.  Genitourinary: Uterus normal, cervix normal, right adnexa normal and left adnexa normal. Uterus is not enlarged. Cervix exhibits no motion tenderness and no tenderness. Right adnexum displays no mass and no tenderness. Left adnexum displays no mass and no tenderness. Vulva exhibits exudate. Vulva exhibits no erythema, no lesion, no rash and no tenderness. Vagina exhibits no lesion. Creamy  white and vaginal discharge found.  Neurological: She is oriented to person, place, and time.  Vitals reviewed.   Results: Results for orders placed or performed in visit on 10/19/16 (from the past 48 hour(s))  POCT Wet Prep with KOH     Status: Abnormal   Collection Time: 10/19/16 10:27 AM  Result Value Ref Range   Trichomonas, UA Negative    Clue Cells Wet Prep HPF POC pos    Epithelial Wet Prep HPF POC  Few, Moderate, Many, Too numerous to count   Yeast Wet Prep HPF POC neg    Bacteria Wet Prep HPF POC  Few   RBC Wet Prep HPF POC     WBC Wet Prep HPF POC     KOH Prep POC Positive (A)  Negative     Assessment/Plan: Bacterial vaginosis - Try Rx cleocin vag crm given recurrence of sx. Check One Swab AV, BV, yeast. Will f/u with results. Dove sens skin soap/unscented wipes. - Plan: Other/Misc lab test, clindamycin (CLEOCIN) 2 % vaginal cream, POCT Wet Prep with KOH  Vaginal discharge - Plan: Other/Misc lab test, POCT Wet Prep with KOH  Screening for STD (sexually transmitted disease) - Check One Swab leukorrhea panel. Will f/u with results.     Return if symptoms worsen or fail to improve.  Luxe Cuadros B. Karesa Maultsby, PA-C 10/19/2016 10:29 AM

## 2016-10-20 ENCOUNTER — Encounter: Payer: Self-pay | Admitting: *Deleted

## 2016-10-21 ENCOUNTER — Encounter: Payer: Self-pay | Admitting: Obstetrics and Gynecology

## 2016-10-23 ENCOUNTER — Encounter
Admission: RE | Disposition: A | Discharge status: Home / Self Care | Payer: Self-pay | Source: Ambulatory Visit | Attending: Gastroenterology

## 2016-10-23 ENCOUNTER — Encounter: Payer: Self-pay | Admitting: *Deleted

## 2016-10-23 ENCOUNTER — Ambulatory Visit: Payer: Medicare Other | Admitting: Anesthesiology

## 2016-10-23 ENCOUNTER — Ambulatory Visit
Admission: RE | Admit: 2016-10-23 | Discharge: 2016-10-23 | Disposition: A | Discharge status: Home / Self Care | Payer: Medicare Other | Source: Ambulatory Visit | Attending: Gastroenterology | Admitting: Gastroenterology

## 2016-10-23 DIAGNOSIS — Z1211 Encounter for screening for malignant neoplasm of colon: Secondary | ICD-10-CM | POA: Diagnosis not present

## 2016-10-23 DIAGNOSIS — D122 Benign neoplasm of ascending colon: Secondary | ICD-10-CM | POA: Insufficient documentation

## 2016-10-23 DIAGNOSIS — Z79899 Other long term (current) drug therapy: Secondary | ICD-10-CM | POA: Diagnosis not present

## 2016-10-23 DIAGNOSIS — K562 Volvulus: Secondary | ICD-10-CM | POA: Diagnosis not present

## 2016-10-23 DIAGNOSIS — K5730 Diverticulosis of large intestine without perforation or abscess without bleeding: Secondary | ICD-10-CM | POA: Diagnosis not present

## 2016-10-23 DIAGNOSIS — Z853 Personal history of malignant neoplasm of breast: Secondary | ICD-10-CM | POA: Insufficient documentation

## 2016-10-23 DIAGNOSIS — G4700 Insomnia, unspecified: Secondary | ICD-10-CM | POA: Diagnosis not present

## 2016-10-23 DIAGNOSIS — K219 Gastro-esophageal reflux disease without esophagitis: Secondary | ICD-10-CM | POA: Insufficient documentation

## 2016-10-23 DIAGNOSIS — E785 Hyperlipidemia, unspecified: Secondary | ICD-10-CM | POA: Insufficient documentation

## 2016-10-23 DIAGNOSIS — E669 Obesity, unspecified: Secondary | ICD-10-CM | POA: Insufficient documentation

## 2016-10-23 DIAGNOSIS — I10 Essential (primary) hypertension: Secondary | ICD-10-CM | POA: Diagnosis not present

## 2016-10-23 DIAGNOSIS — Z9221 Personal history of antineoplastic chemotherapy: Secondary | ICD-10-CM | POA: Diagnosis not present

## 2016-10-23 DIAGNOSIS — Z87891 Personal history of nicotine dependence: Secondary | ICD-10-CM | POA: Insufficient documentation

## 2016-10-23 DIAGNOSIS — Z955 Presence of coronary angioplasty implant and graft: Secondary | ICD-10-CM | POA: Insufficient documentation

## 2016-10-23 DIAGNOSIS — M810 Age-related osteoporosis without current pathological fracture: Secondary | ICD-10-CM | POA: Insufficient documentation

## 2016-10-23 DIAGNOSIS — Z86010 Personal history of colonic polyps: Secondary | ICD-10-CM | POA: Insufficient documentation

## 2016-10-23 DIAGNOSIS — Z880 Allergy status to penicillin: Secondary | ICD-10-CM | POA: Insufficient documentation

## 2016-10-23 DIAGNOSIS — Z6832 Body mass index (BMI) 32.0-32.9, adult: Secondary | ICD-10-CM | POA: Insufficient documentation

## 2016-10-23 HISTORY — PX: COLONOSCOPY WITH PROPOFOL: SHX5780

## 2016-10-23 SURGERY — COLONOSCOPY WITH PROPOFOL
Anesthesia: General

## 2016-10-23 MED ORDER — LIDOCAINE 2% (20 MG/ML) 5 ML SYRINGE
INTRAMUSCULAR | Status: DC | PRN
  Administered 2016-10-23: 40 mg via INTRAVENOUS

## 2016-10-23 MED ORDER — MIDAZOLAM HCL 5 MG/5ML IJ SOLN
INTRAMUSCULAR | Status: DC | PRN
  Administered 2016-10-23 (×2): 1 mg via INTRAVENOUS

## 2016-10-23 MED ORDER — PROPOFOL 10 MG/ML IV BOLUS
INTRAVENOUS | Status: DC | PRN
  Administered 2016-10-23: 100 mg via INTRAVENOUS

## 2016-10-23 MED ORDER — SODIUM CHLORIDE 0.9 % IV SOLN
INTRAVENOUS | Status: DC
  Administered 2016-10-23: 1000 mL via INTRAVENOUS

## 2016-10-23 MED ORDER — FENTANYL CITRATE (PF) 100 MCG/2ML IJ SOLN
INTRAMUSCULAR | Status: AC
  Filled 2016-10-23: qty 2

## 2016-10-23 MED ORDER — PROPOFOL 500 MG/50ML IV EMUL
INTRAVENOUS | Status: AC
  Filled 2016-10-23: qty 50

## 2016-10-23 MED ORDER — PHENYLEPHRINE HCL 10 MG/ML IJ SOLN
INTRAMUSCULAR | Status: DC | PRN
  Administered 2016-10-23 (×3): 100 ug via INTRAVENOUS

## 2016-10-23 MED ORDER — FENTANYL CITRATE (PF) 100 MCG/2ML IJ SOLN
INTRAMUSCULAR | Status: DC | PRN
  Administered 2016-10-23 (×2): 50 ug via INTRAVENOUS

## 2016-10-23 MED ORDER — MIDAZOLAM HCL 2 MG/2ML IJ SOLN
INTRAMUSCULAR | Status: AC
  Filled 2016-10-23: qty 2

## 2016-10-23 MED ORDER — SODIUM CHLORIDE 0.9 % IV SOLN: INTRAVENOUS | Status: DC

## 2016-10-23 MED ORDER — PROPOFOL 500 MG/50ML IV EMUL
INTRAVENOUS | Status: DC | PRN
  Administered 2016-10-23: 160 ug/kg/min via INTRAVENOUS

## 2016-10-23 NOTE — Anesthesia Post-op Follow-up Note (Cosign Needed)
Anesthesia QCDR form completed.        

## 2016-10-23 NOTE — Anesthesia Preprocedure Evaluation (Addendum)
Anesthesia Evaluation  Patient identified by MRN, date of birth, ID band Patient awake    Reviewed: Allergy & Precautions, NPO status , Patient's Chart, lab work & pertinent test results  History of Anesthesia Complications Negative for: history of anesthetic complications  Airway Mallampati: II  TM Distance: >3 FB Neck ROM: Full    Dental  (+) Upper Dentures, Lower Dentures   Pulmonary neg pulmonary ROS, former smoker,           Cardiovascular Exercise Tolerance: Good hypertension, (-) angina(-) CAD, (-) Past MI, (-) Cardiac Stents and (-) CABG (-) dysrhythmias (-) Valvular Problems/Murmurs     Neuro/Psych negative neurological ROS  negative psych ROS   GI/Hepatic Neg liver ROS, GERD  ,  Endo/Other  negative endocrine ROS  Renal/GU negative Renal ROS  negative genitourinary   Musculoskeletal negative musculoskeletal ROS (+)   Abdominal (+) + obese,   Peds negative pediatric ROS (+)  Hematology negative hematology ROS (+)   Anesthesia Other Findings Past Medical History: No date: Acute vaginitis 2001: Breast cancer ([place])     Comment: right breast-followed by modified radical               mastectomy and chemotherapy 1992: Breast cancer ([place])     Comment: left-post mastectomy and "hormonal               manipulation" 2012: Colon polyp 07/2013: Constipation, chronic 01/2016: Fractured pelvis ([place]) No date: Hepatic steatosis 2001: History of chemotherapy 10/2014: Hyperlipidemia No date: Hypertension No date: Insomnia No date: Neuropathy 2012: Osteoporosis     Comment: tx with reclast   Reproductive/Obstetrics negative OB ROS                             Anesthesia Physical  Anesthesia Plan  ASA: III  Anesthesia Plan: General   Post-op Pain Management:    Induction: Intravenous  Airway Management Planned:   Additional Equipment:   Intra-op Plan:   Post-operative  Plan:   Informed Consent: I have reviewed the patients History and Physical, chart, labs and discussed the procedure including the risks, benefits and alternatives for the proposed anesthesia with the patient or authorized representative who has indicated his/her understanding and acceptance.   Dental advisory given  Plan Discussed with: CRNA and Surgeon  Anesthesia Plan Comments:        Anesthesia Quick Evaluation

## 2016-10-23 NOTE — H&P (Signed)
Outpatient short stay form Pre-procedure 10/23/2016 9:47 AM Lollie Sails MD  Primary Physician: Dr. Glendon Axe  Reason for visit:  Colonoscopy  History of present illness:  Patient is a 76 year old female presenting today as above. She has personal history of adenomatous colon polyps and is due for her repeat procedure. Her shortness of breath recently and was evaluated by pulmonology this improved. He tolerated her prep well. She takes no aspirin or blood thinning agents.    Current Facility-Administered Medications:  .  0.9 %  sodium chloride infusion, , Intravenous, Continuous, Lollie Sails, MD, Last Rate: 20 mL/hr at 10/23/16 0931, 1,000 mL at 10/23/16 0931 .  0.9 %  sodium chloride infusion, , Intravenous, Continuous, Lollie Sails, MD  Prescriptions Prior to Admission  Medication Sig Dispense Refill Last Dose  . losartan (COZAAR) 100 MG tablet Take by mouth.   10/23/2016 at 0700  . [DISCONTINUED] losartan-hydrochlorothiazide (HYZAAR) 100-12.5 MG tablet TAKE 1 TABLET BY MOUTH EVERY DAY   10/23/2016 at Unknown time  . acetaminophen (TYLENOL) 325 MG tablet Take 650 mg by mouth every 6 (six) hours as needed for mild pain or headache.    Not Taking  . CHROMIUM PO Take 1 tablet by mouth daily.    Not Taking  . clindamycin (CLEOCIN) 2 % vaginal cream Place 1 Applicatorful vaginally at bedtime. (Patient not taking: Reported on 10/20/2016) 40 g 0 Completed Course at Unknown time  . dicyclomine (BENTYL) 20 MG tablet Take 20 mg by mouth 3 (three) times daily as needed for spasms.   Not Taking  . ezetimibe (ZETIA) 10 MG tablet Take 10 mg by mouth daily.    Not Taking  . furosemide (LASIX) 20 MG tablet Take 20 mg by mouth daily.   Not Taking  . lansoprazole (PREVACID) 30 MG capsule    Not Taking  . losartan-hydrochlorothiazide (HYZAAR) 100-12.5 MG per tablet Take 1 tablet by mouth every morning.    Taking  . metroNIDAZOLE (METROGEL) 0.75 % vaginal gel Place 9.48 Applicatorfuls  vaginally as needed.   Not Taking  . Multiple Vitamin (MULTI-VITAMINS) TABS Take 1 tablet by mouth daily.    Taking  . Multiple Vitamin (MULTI-VITAMINS) TABS Take by mouth.   Not Taking  . QUEtiapine (SEROQUEL) 50 MG tablet Take 50 mg by mouth at bedtime.    Taking  . QUEtiapine (SEROQUEL) 50 MG tablet TAKE 1 TABLET (50 MG TOTAL) BY MOUTH NIGHTLY.   Not Taking  . [DISCONTINUED] cholecalciferol (VITAMIN D) 1000 units tablet Take 1,000 Units by mouth daily.   Not Taking  . [DISCONTINUED] dicyclomine (BENTYL) 20 MG tablet Take 1 tablet (20 mg total) by mouth 3 (three) times daily as needed for spasms. (Patient not taking: Reported on 10/19/2016) 30 tablet 0 Not Taking  . [DISCONTINUED] ezetimibe (ZETIA) 10 MG tablet Take by mouth.   Not Taking  . [DISCONTINUED] furosemide (LASIX) 20 MG tablet Take by mouth.   Not Taking  . [DISCONTINUED] lansoprazole (PREVACID) 30 MG capsule Take by mouth.   Not Taking     Allergies  Allergen Reactions  . Penicillin G Other (See Comments)    Other reaction(s): Dizziness, passed out Other than being dizzy and fainting, the patient does not remember the specifics of the reaction.   Marland Kitchen Cymbalta [Duloxetine Hcl] Rash and Other (See Comments)    Reaction: Sensitive to light  . Lipitor [Atorvastatin] Rash and Other (See Comments)    Reaction: Memory loss     Past Medical  History:  Diagnosis Date  . Acute vaginitis   . Breast cancer ([place]) 2001   right breast-followed by modified radical mastectomy and chemotherapy  . Breast cancer ([place]) 1992   left-post mastectomy and "hormonal manipulation"  . Colon polyp 2012  . Constipation, chronic 07/2013  . Fractured pelvis ([place]) 01/2016  . Hepatic steatosis   . History of chemotherapy 2001  . Hyperlipidemia 10/2014  . Hypertension   . Insomnia   . Neuropathy   . Osteoporosis 2012   tx with reclast    Review of systems:      Physical Exam    Heart and lungs: Regular rate and rhythm without rub or gallop,  lungs are bilaterally clear.    HEENT: Normocephalic atraumatic eyes are anicteric    Other:     Pertinant exam for procedure: Soft nontender nondistended bowel sounds positive normoactive.    Planned proceedures: Colonoscopy and indicated procedures. I have discussed the risks benefits and complications of procedures to include not limited to bleeding, infection, perforation and the risk of sedation and the patient wishes to proceed.    Lollie Sails, MD Gastroenterology 10/23/2016  9:47 AM

## 2016-10-23 NOTE — Transfer of Care (Signed)
Immediate Anesthesia Transfer of Care Note  Patient: Alexis Arias  Procedure(s) Performed: Procedure(s): COLONOSCOPY WITH PROPOFOL (N/A)  Patient Location: PACU and Endoscopy Unit  Anesthesia Type:General  Level of Consciousness: awake and drowsy  Airway & Oxygen Therapy: Patient Spontanous Breathing and Patient connected to nasal cannula oxygen  Post-op Assessment: Report given to RN and Post -op Vital signs reviewed and stable  Post vital signs: Reviewed and stable  Last Vitals:  Vitals:   10/23/16 0917 10/23/16 1100  BP: 133/81 99/60  Pulse: 69 74  Resp: 20 20  Temp: 36.6 C (!) 35.3 C    Last Pain:  Vitals:   10/23/16 1100  TempSrc: Tympanic         Complications: No apparent anesthesia complications

## 2016-10-23 NOTE — Op Note (Signed)
[facility] Gastroenterology Patient Name: Alexis Arias Procedure Date: 10/23/2016 9:51 AM MRN: [id number] Account #: [id number] Date of Birth: 13-Dec-1940 Admit Type: Outpatient Age: 76 Room: [facility] ENDO ROOM 3 Gender: Female Note Status: Finalized Procedure:            Colonoscopy Indications:          Personal history of colonic polyps Providers:            Lollie Sails, MD Medicines:            Monitored Anesthesia Care Complications:        No immediate complications. Procedure:            Pre-Anesthesia Assessment:                       - ASA Grade Assessment: III - A patient with severe                        systemic disease.                       After obtaining informed consent, the colonoscope was                        passed under direct vision. Throughout the procedure,                        the patient's blood pressure, pulse, and oxygen                        saturations were monitored continuously. The                        Colonoscope was introduced through the anus and                        advanced to the the cecum, identified by appendiceal                        orifice and ileocecal valve. The colonoscopy was                        unusually difficult due to poor bowel prep and a                        tortuous colon. Successful completion of the procedure                        was aided by changing the patient to a prone position,                        using manual pressure and lavage. The patient tolerated                        the procedure well. The quality of the bowel                        preparation was fair except the ascending colon was                        poor. Findings:  A few small-mouthed diverticula were found in the sigmoid colon and       distal descending colon.      Two sessile polyps were found in the ascending colon. The polyps were 3       to 4 mm in size. These polyps were removed with a cold biopsy  forceps.       Resection and retrieval were complete.      The retroflexed view of the distal rectum and anal verge was normal and       showed no anal or rectal abnormalities. Impression:           - Diverticulosis in the sigmoid colon and in the distal                        descending colon.                       - Two 3 to 4 mm polyps in the ascending colon, removed                        with a cold biopsy forceps. Resected and retrieved.                       - The distal rectum and anal verge are normal on                        retroflexion view. Recommendation:       - Discharge patient to home.                       - Soft diet for 3 days. Procedure Code(s):    --- Professional ---                       [phone], Colonoscopy, flexible; with biopsy, single or                        multiple Diagnosis Code(s):    --- Professional ---                       D12.2, Benign neoplasm of ascending colon                       Z86.010, Personal history of colonic polyps                       K57.30, Diverticulosis of large intestine without                        perforation or abscess without bleeding CPT copyright 2016 American Medical Association. All rights reserved. The codes documented in this report are preliminary and upon coder review may  be revised to meet current compliance requirements. Lollie Sails, MD 10/23/2016 11:02:29 AM This report has been signed electronically. Number of Addenda: 0 Note Initiated On: 10/23/2016 9:51 AM Scope Withdrawal Time: 0 hours 36 minutes 49 seconds  Total Procedure Duration: 0 hours 52 minutes 35 seconds       [facility]

## 2016-10-23 NOTE — Anesthesia Postprocedure Evaluation (Signed)
Anesthesia Post Note  Patient: Alexis Arias  Procedure(s) Performed: Procedure(s) (LRB): COLONOSCOPY WITH PROPOFOL (N/A)  Patient location during evaluation: Endoscopy Anesthesia Type: General Level of consciousness: awake and alert Pain management: pain level controlled Vital Signs Assessment: post-procedure vital signs reviewed and stable Respiratory status: spontaneous breathing, nonlabored ventilation, respiratory function stable and patient connected to nasal cannula oxygen Cardiovascular status: blood pressure returned to baseline and stable Postop Assessment: no signs of nausea or vomiting Anesthetic complications: no     Last Vitals:  Vitals:   10/23/16 1120 10/23/16 1130  BP: 133/75 135/89  Pulse:    Resp:    Temp:      Last Pain:  Vitals:   10/23/16 1100  TempSrc: Tympanic                 Martha Clan

## 2016-10-24 ENCOUNTER — Encounter: Payer: Self-pay | Admitting: Gastroenterology

## 2016-10-24 LAB — SURGICAL PATHOLOGY

## 2016-10-26 ENCOUNTER — Telehealth: Payer: Self-pay

## 2016-10-26 NOTE — Telephone Encounter (Signed)
Pt aware of neg AV, BV, yeast, and STD culture. Pt's sx improved with changing to Dove sens skin soap, no scented wipes. She stopped douching too. If sx persist, may be able to try vag ERT or [place] minimally for about a week, but pt with hx of breast cancer. Prefer not to do that.  F/u prn.

## 2016-10-26 NOTE — Telephone Encounter (Signed)
Pt calling for results.  [phone].

## 2017-01-29 ENCOUNTER — Ambulatory Visit (INDEPENDENT_AMBULATORY_CARE_PROVIDER_SITE_OTHER): Payer: Medicare Other | Admitting: Podiatry

## 2017-01-29 DIAGNOSIS — L03119 Cellulitis of unspecified part of limb: Secondary | ICD-10-CM | POA: Diagnosis not present

## 2017-01-29 DIAGNOSIS — B351 Tinea unguium: Secondary | ICD-10-CM | POA: Diagnosis not present

## 2017-01-29 DIAGNOSIS — M21611 Bunion of right foot: Secondary | ICD-10-CM | POA: Diagnosis not present

## 2017-01-29 DIAGNOSIS — M2011 Hallux valgus (acquired), right foot: Secondary | ICD-10-CM

## 2017-01-29 DIAGNOSIS — L02619 Cutaneous abscess of unspecified foot: Secondary | ICD-10-CM

## 2017-01-29 DIAGNOSIS — M79609 Pain in unspecified limb: Secondary | ICD-10-CM

## 2017-01-29 MED ORDER — DOXYCYCLINE HYCLATE 100 MG PO TABS: 100.0000 mg | ORAL_TABLET | Freq: Two times a day (BID) | ORAL | 0 refills | Status: AC

## 2017-01-29 MED ORDER — TRAMADOL HCL 50 MG PO TABS: 50.0000 mg | ORAL_TABLET | Freq: Four times a day (QID) | ORAL | 0 refills | Status: AC | PRN

## 2017-01-29 NOTE — Progress Notes (Signed)
Complaint:  Visit Type: Patient returns to my office for continued preventative foot care services. Complaint: Patient states" my nails have grown long and thick and become painful to walk and wear shoes"  The patient presents for preventative foot care services. No changes to ROS. Patient says she was bit by a bee last night and her left foot has become red,swollen and painful.  She points to the big toe joint as the site of the bite.  She has history similar problem in her right foot weeks ago which healed uneventfully.  She says she elevated and placed ice on her left foot.  Podiatric Exam: Vascular: dorsalis pedis and posterior tibial pulses are palpable bilateral. Capillary return is immediate. Temperature gradient is WNL. Skin turgor WNL  Sensorium: Normal Semmes Weinstein monofilament test. Normal tactile sensation bilaterally. Nail Exam: Pt has thick disfigured discolored nails with subungual debris noted bilateral entire nail hallux through fifth toenails Ulcer Exam: There is no evidence of ulcer or pre-ulcerative changes or infection. Orthopedic Exam: Muscle tone and strength are WNL. No limitations in general ROM. No crepitus or effusions noted. Foot type and digits show no abnormalities. Bony prominences are unremarkable. Skin: No Porokeratosis. No infection or ulcers.  Red swollen and painful left foot in the absence of lymphangitis.    Diagnosis:  Onychomycosis, , Pain in right toe, pain in left toes,  Cellulitis left foot.  Treatment & Plan Procedures and Treatment: Consent by patient was obtained for treatment procedures. The patient understood the discussion of treatment and procedures well. All questions were answered thoroughly reviewed. Debridement of mycotic and hypertrophic toenails, 1 through 5 bilateral and clearing of subungual debris. No ulceration, no infection noted. Patient was prescribed doxycycline for her probable infection.  She was also called in tramadol for pain.     If this condition worsens or becomes very painful, the patient was told to contact this office or go to the Emergency Department at the hospital.   Return Visit-Office Procedure: Patient instructed to return to the office for a follow up visit 3 months for continued evaluation and treatment.    Gardiner Barefoot DPM

## 2017-02-01 ENCOUNTER — Telehealth: Payer: Self-pay | Admitting: Podiatry

## 2017-02-01 NOTE — Telephone Encounter (Signed)
Pt called back stating that her PCP is going to take care of what she called about earlier this morning. Stated if Dr. Prudence Davidson had already taken care of it that is fine but that he thought her PCP should take care of it.

## 2017-02-01 NOTE — Telephone Encounter (Signed)
Patient called about getting a refill on Medrol.  This RX was prescribed by [facility], but patient has run out and [facility] wont refill.  Would like Dr. Prudence Davidson to write a new rx for her.  Please call patient when ready.

## 2017-02-01 NOTE — Telephone Encounter (Signed)
I do not order medrol for cellulitis.  She needs to return to [facility] to pick up dosepak.

## 2017-05-01 ENCOUNTER — Ambulatory Visit: Payer: BC Managed Care – PPO | Admitting: Podiatry

## 2017-05-10 ENCOUNTER — Other Ambulatory Visit: Payer: Self-pay | Admitting: Internal Medicine

## 2017-05-10 NOTE — Progress Notes (Signed)
Orders are in

## 2017-05-11 ENCOUNTER — Inpatient Hospital Stay: Payer: Medicare Other

## 2017-05-11 ENCOUNTER — Inpatient Hospital Stay: Payer: Medicare Other | Admitting: Internal Medicine

## 2017-05-11 NOTE — Progress Notes (Deleted)
Door NOTE  Patient Care Team: Glendon Axe, MD as PCP - General (Internal Medicine) Ubaldo Glassing Javier Docker, MD as Consulting Physician (Cardiology) Bary Castilla, Forest Gleason, MD (General Surgery)  CHIEF COMPLAINTS/PURPOSE OF CONSULTATION:   Oncology History   # 1992- LEFT BREAST CANCER s/p Mastec; tamoxifen; # 2001- RIGHT BREAST CANCER s/p Mastec; chemo; Tamoxifen [Dr.Kasari;Oncology,[place]; [facility]]; NOV 2016- CT-C/A/P- NED; MRI abdo-NEG.   # OCT 2016- BMD- Osteopenia on Reclast q 12; D&C June 2016- Neg for malignancy; Bil. Oopherectomy;   # Chronic LOW back pain/arthiris-      Carcinoma of overlapping sites of right breast in female, estrogen receptor positive ([place])      HISTORY OF PRESENTING ILLNESS:  Alexis Arias 76 y.o.  female above history of bilateral breast cancer; most recent one in 2001- Is here for follow-up.  Patient interim had a fall and had a fracture of her foot; was a rehabilitation currently at home. Patient is worried about osteopenia/osteoporosis. Interest in having Reclast; she had good results in the past.  Patient continues to complain of chronic worsening back pain; patient has had steroid injections in the past. No bone pain. No lumps or bumps.  ROS:No new shortness of breath or cough. No new lumps or bumps.  A complete 10 point review of system is done which is negative except mentioned above in history of present illness. MEDICAL HISTORY:  Past Medical History:  Diagnosis Date  . Acute vaginitis   . Breast cancer ([place]) 2001   right breast-followed by modified radical mastectomy and chemotherapy  . Breast cancer ([place]) 1992   left-post mastectomy and "hormonal manipulation"  . Colon polyp 2012  . Constipation, chronic 07/2013  . Fractured pelvis ([place]) 01/2016  . Hepatic steatosis   . History of chemotherapy 2001  . Hyperlipidemia 10/2014  . Hypertension   . Insomnia   . Neuropathy   . Osteoporosis 2012   tx with reclast     SURGICAL HISTORY: Past Surgical History:  Procedure Laterality Date  . BREAST RECONSTRUCTION Bilateral 2003  . BREAST REDUCTION SURGERY  1980's  . BREAST SURGERY Bilateral 2007   Dr Christin Bach [place] saline implants  . COLONOSCOPY  2012  . COLONOSCOPY WITH PROPOFOL N/A 10/23/2016   Procedure: COLONOSCOPY WITH PROPOFOL;  Surgeon: Lollie Sails, MD;  Location: [facility] ENDOSCOPY;  Service: Endoscopy;  Laterality: N/A;  . DILATION AND CURETTAGE OF UTERUS  2016  . HYSTEROSCOPY W/D&C N/A 11/26/2014   Procedure: DILATATION AND CURETTAGE /HYSTEROSCOPY;  Surgeon: Honor Loh Ward, MD;  Location: [facility] ORS;  Service: Gynecology;  Laterality: N/A;  . MASTECTOMY Left 1992   Dr Elenore Rota Brief in [place]   . MASTECTOMY Right 2001   Dr Rulon Sera in [address] wtih reconstruction (silicone)  . ovaries removed    . tummy tuck  1980's  . UPPER GI ENDOSCOPY  07/2013    SOCIAL HISTORY: Social History   Socioeconomic History  . Marital status: Widowed    Spouse name: Not on file  . Number of children: Not on file  . Years of education: Not on file  . Highest education level: Not on file  Social Needs  . Financial resource strain: Not on file  . Food insecurity - worry: Not on file  . Food insecurity - inability: Not on file  . Transportation needs - medical: Not on file  . Transportation needs - non-medical: Not on file  Occupational History  . Not on file  Tobacco  Use  . Smoking status: Former Smoker    Types: Cigarettes    Last attempt to quit: 06/27/2011    Years since quitting: 5.8  . Smokeless tobacco: Never Used  . Tobacco comment: 1 cigarette a day  Substance and Sexual Activity  . Alcohol use: No    Alcohol/week: 0.0 oz  . Drug use: No  . Sexual activity: Yes    Birth control/protection: Post-menopausal  Other Topics Concern  . Not on file  Social History Narrative  . Not on file    FAMILY HISTORY: Family History  Problem Relation Age of Onset  . Heart disease  Mother   . Alzheimer's disease Father     ALLERGIES:  is allergic to penicillin g; cymbalta [duloxetine hcl]; and lipitor [atorvastatin].  MEDICATIONS:  Current Outpatient Medications  Medication Sig Dispense Refill  . acetaminophen (TYLENOL) 325 MG tablet Take 650 mg by mouth every 6 (six) hours as needed for mild pain or headache.     . augmented betamethasone dipropionate (DIPROLENE-AF) 0.05 % ointment     . CHROMIUM PO Take 1 tablet by mouth daily.     Marland Kitchen dicyclomine (BENTYL) 20 MG tablet Take by mouth.    . doxycycline (VIBRA-TABS) 100 MG tablet Take 1 tablet (100 mg total) by mouth 2 (two) times daily. 20 tablet 0  . ezetimibe (ZETIA) 10 MG tablet Take 10 mg by mouth daily.     . furosemide (LASIX) 20 MG tablet Take 20 mg by mouth daily.    . lansoprazole (PREVACID) 30 MG capsule     . losartan (COZAAR) 100 MG tablet Take by mouth.    . losartan-hydrochlorothiazide (HYZAAR) 100-12.5 MG per tablet Take 1 tablet by mouth every morning.     . metroNIDAZOLE (METROGEL) 0.75 % vaginal gel Place 6.83 Applicatorfuls vaginally as needed.    . mometasone (ELOCON) 0.1 % cream Apply topically.    . Multiple Vitamin (MULTI-VITAMINS) TABS Take by mouth.    . QUEtiapine (SEROQUEL) 50 MG tablet TAKE 1 TABLET (50 MG TOTAL) BY MOUTH NIGHTLY.    . traMADol (ULTRAM) 50 MG tablet Take 1 tablet (50 mg total) by mouth every 6 (six) hours as needed. 30 tablet 0   No current facility-administered medications for this visit.       Marland Kitchen  PHYSICAL EXAMINATION: ECOG PERFORMANCE STATUS: 0 - Asymptomatic  There were no vitals filed for this visit. There were no vitals filed for this visit.  GENERAL: Well-nourished well-developed; Alert, no distress and comfortable. She is alone. HEENT-Atraumatic normocephalic pupils equal reactive to light. Cardiovascular-regular rate and rhythm and no murmurs. No lower extremity edema Lungs-clear to auscultation bilaterally. No wheeze or crackles Neurologically alert  and oriented 3. No focal deficits. Abdomen soft nontender nondistended. No hepatomegaly.     LABORATORY DATA:  I have reviewed the data as listed Lab Results  Component Value Date   WBC 5.1 09/16/2016   HGB 12.8 09/16/2016   HCT 37.1 09/16/2016   MCV 89.2 09/16/2016   PLT 250 09/16/2016   Recent Labs    05/12/16 1047 06/15/16 1000 09/16/16 0447  NA 136 137 137  K 4.1 3.8 3.8  CL 105 107 103  CO2 26 23 27   GLUCOSE 95 141* 125*  BUN 15 14 15   CREATININE 0.77 0.83 0.79  CALCIUM 10.2 9.9 10.1  GFRNONAA >60 >60 >60  GFRAA >60 >60 >60  PROT 8.9*  --  9.1*  ALBUMIN 4.0  --  4.1  AST 21  --  20  ALT 15  --  13*  ALKPHOS 54  --  43  BILITOT 0.5  --  0.9    RADIOGRAPHIC STUDIES: I have personally reviewed the radiological images as listed and agreed with the findings in the report. No results found.  ASSESSMENT & PLAN:  No problem-specific Assessment & Plan notes found for this encounter.       Cammie Sickle, MD 05/11/2017 9:38 AM

## 2017-05-11 NOTE — Assessment & Plan Note (Deleted)
#   Bilateral breast cancer- metachronous [phone] and 2001]; status post mastectomy. As per patient appears to be ER/PR positive. Clinically no evidence of recurrence.   # Osteopenia- BMD Oct 2016- plan to start reclast/ zometa [5mg  IV q 12 months] Recommend vit D + Calcium. Start next week.   # follow up in 12 months/labs/reclast.

## 2017-05-25 ENCOUNTER — Other Ambulatory Visit: Payer: Medicare Other

## 2017-05-25 ENCOUNTER — Ambulatory Visit: Payer: Medicare Other | Admitting: Internal Medicine

## 2017-05-25 ENCOUNTER — Ambulatory Visit: Payer: Medicare Other

## 2017-07-02 ENCOUNTER — Ambulatory Visit: Payer: BC Managed Care – PPO | Admitting: Podiatry

## 2019-02-18 IMAGING — CT CT ABD-PELV W/ CM
2 of 5 series · 15 of 46 positions shown, 17 images · IV contrast (APPLIED)
Comparison: CT abdomen pelvis June 06, 2015; abdominal MRI
June 14, 2015

CLINICAL DATA: Lower abdominal pain with diarrhea

EXAM:
CT ABDOMEN AND PELVIS WITH CONTRAST
TECHNIQUE: Multidetector CT imaging of the abdomen and pelvis was performed
using the standard protocol following bolus administration of
intravenous contrast.
CONTRAST:  100mL OZ121A-XPP IOPAMIDOL (OZ121A-XPP) INJECTION 61%

[Series 2: routine abd/pel with · axial · 0.93mm/px · z∈[-911,-486]mm · 12 of 97 slices shown, 14 images]
[im 6/97  soft-tissue]
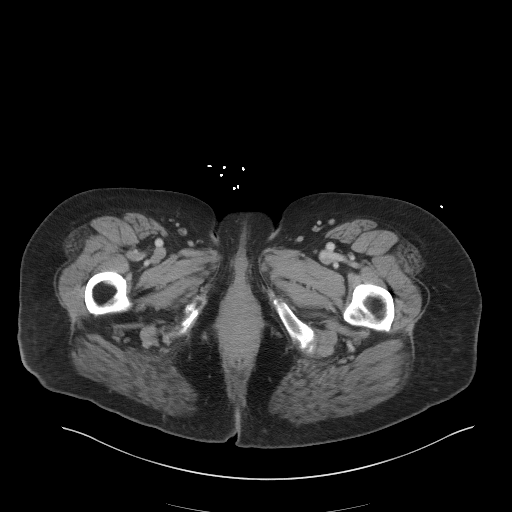
[im 6/97  bone]
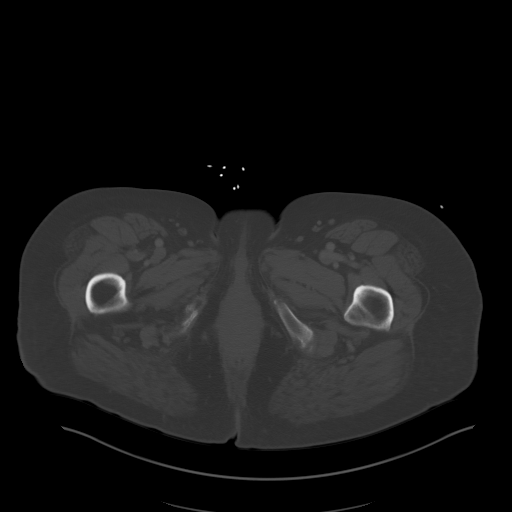
[im 16/97  soft-tissue]
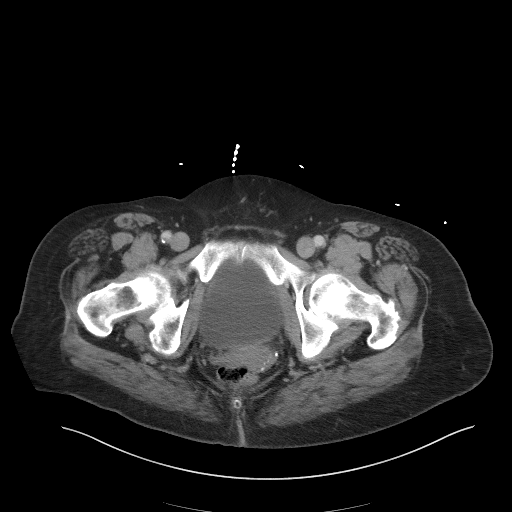
[im 21/97  soft-tissue]
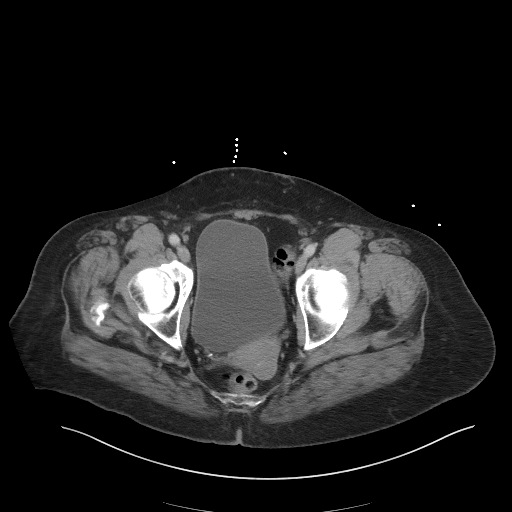
[im 31/97  soft-tissue]
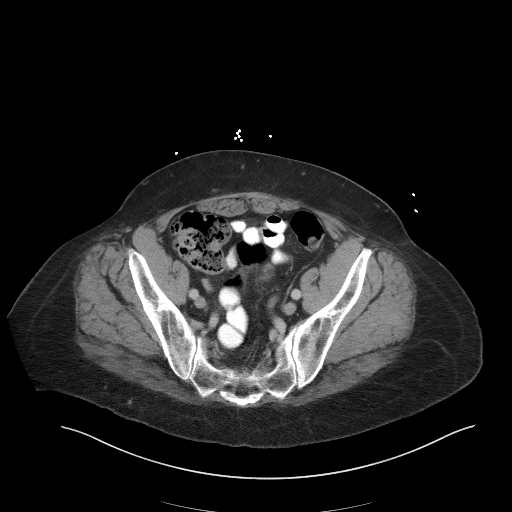
[im 36/97  soft-tissue]
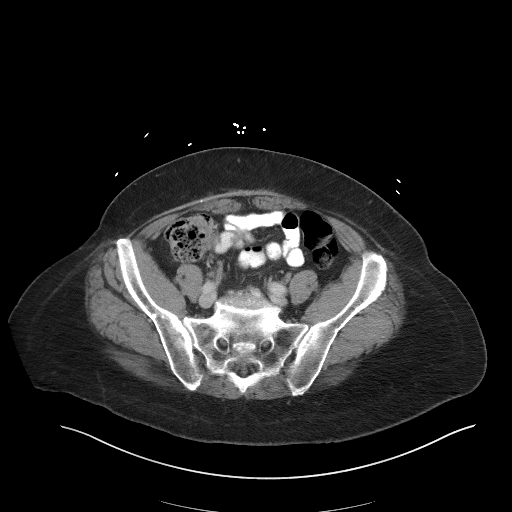
[im 46/97  soft-tissue]
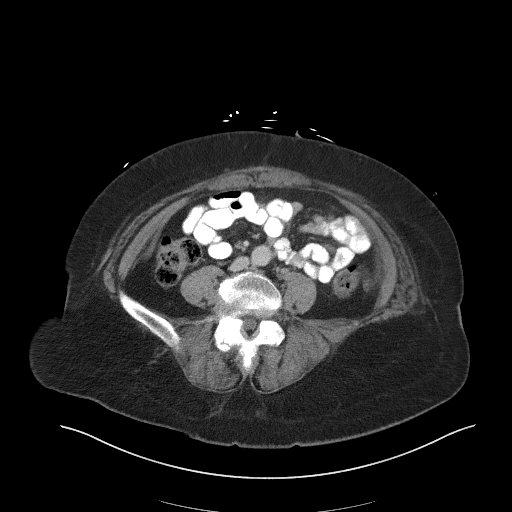
[im 51/97  soft-tissue]
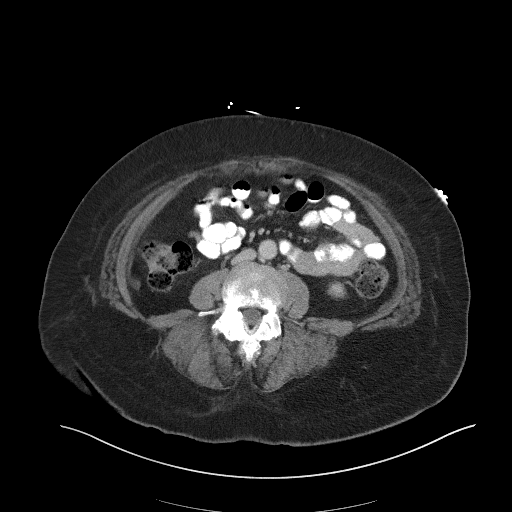
[im 61/97  soft-tissue]
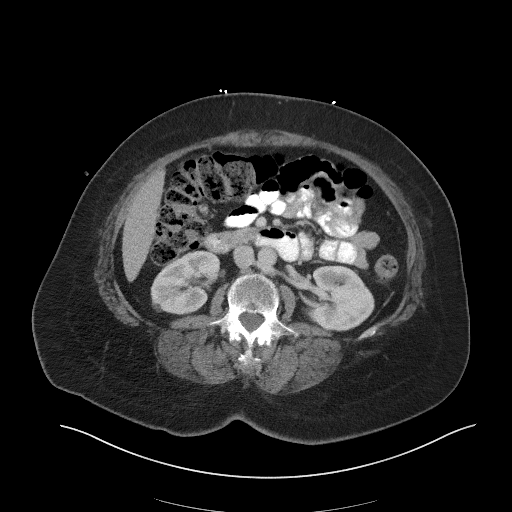
[im 66/97  soft-tissue]
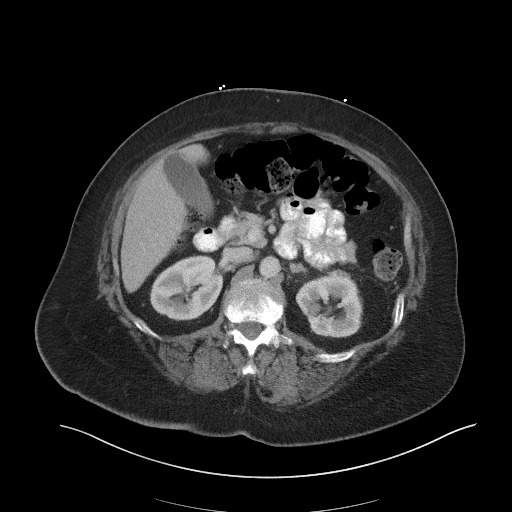
[im 66/97  bone]
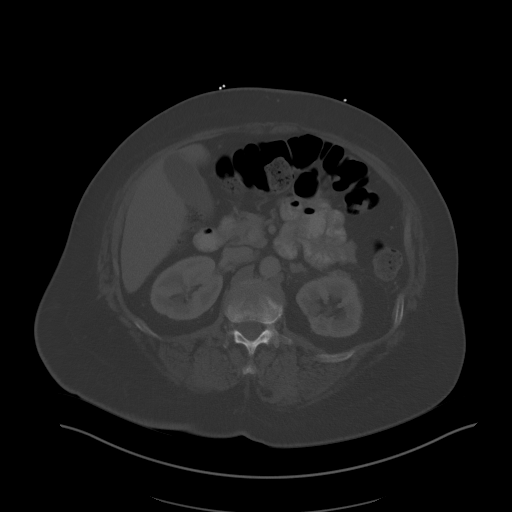
[im 76/97  soft-tissue]
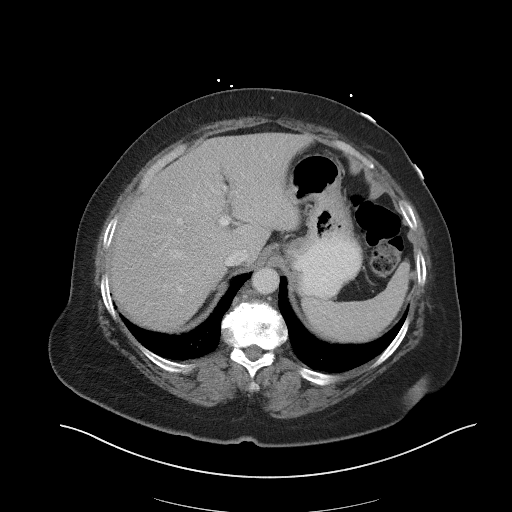
[im 81/97  soft-tissue]
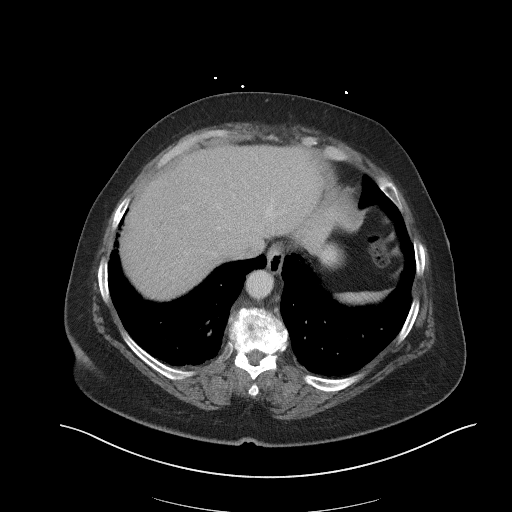
[im 91/97  soft-tissue]
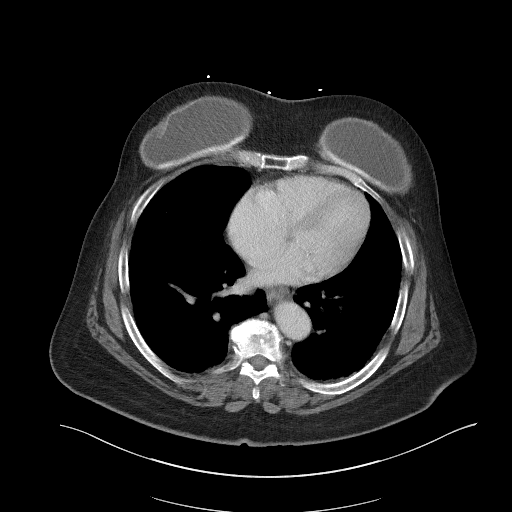

[Series 5: coronal st · coronal · 0.68mm/px · 3 of 102 slices shown]
[im 34/102  soft-tissue]
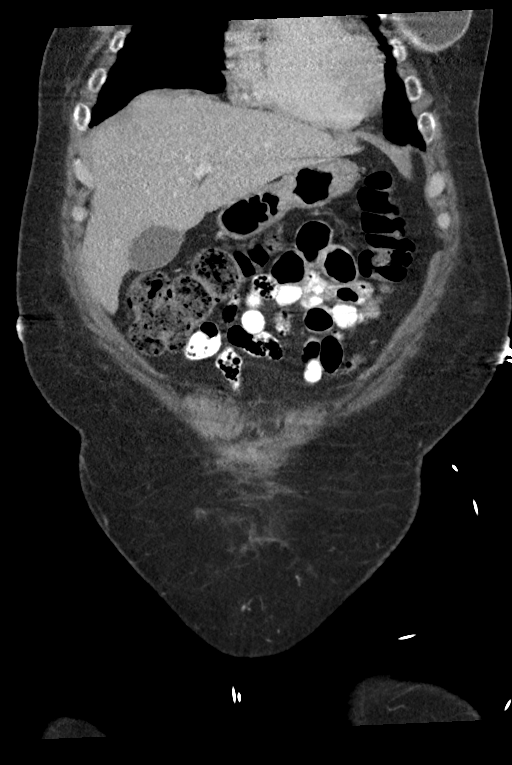
[im 45/102  soft-tissue]
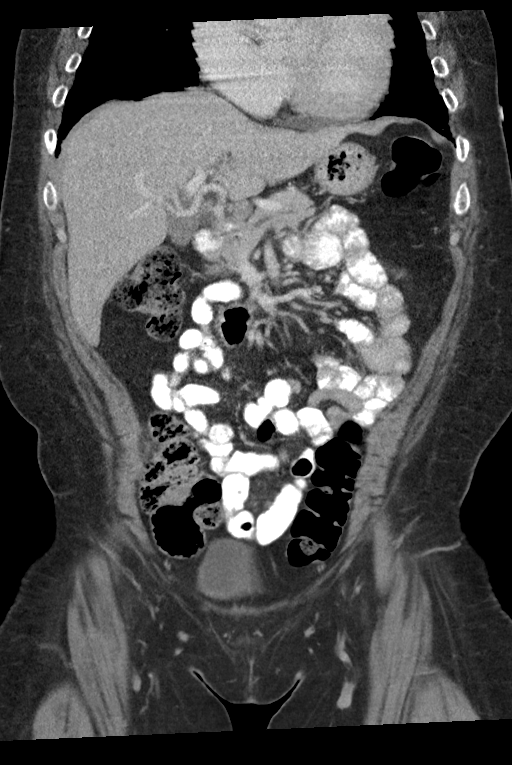
[im 57/102  soft-tissue]
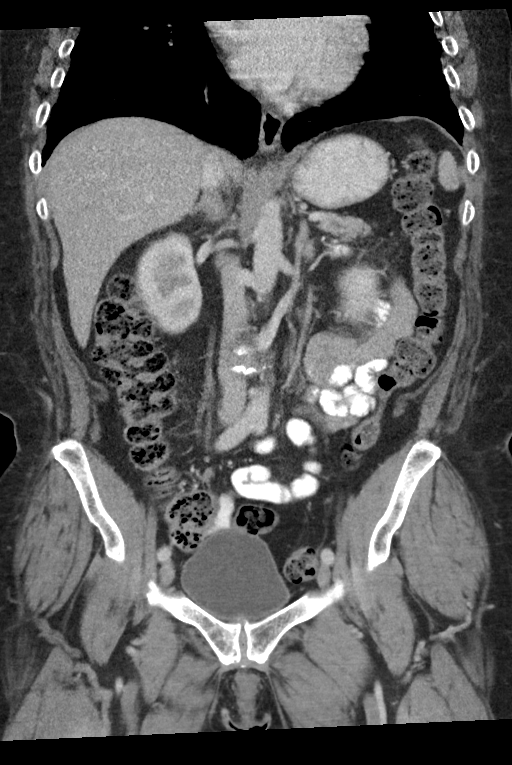

[15 of 46 positions shown; findings below may reference images not displayed]

FINDINGS: Lower chest: There is scarring with mild fibrosis in the lung bases.
There is mild bibasilar atelectasis. No lung base pulmonary edema or
consolidation. There are scattered foci of coronary artery
calcification.

Hepatobiliary: No focal liver lesions are evident. Gallbladder wall
is not appreciably thickened. There is no biliary duct dilatation.

Pancreas: There is no pancreatic mass or inflammatory focus.

Spleen: No splenic lesions are evident.

Adrenals/Urinary Tract: Adrenals appear normal bilaterally. There is
a 9 x 8 mm cyst arising from the lateral mid right kidney. There is
no hydronephrosis on either side. There is no renal or ureteral
calculus on either side. Urinary bladder is midline with wall
thickness within normal limits.

Stomach/Bowel: There is no appreciable bowel wall or mesenteric
thickening. No bowel obstruction. No free air or portal venous air.

Vascular/Lymphatic: There is no abdominal aortic aneurysm. Major
mesenteric vessels appear patent. There is a retroaortic left renal
vein, an anatomic variant. There is no appreciable adenopathy in the
abdomen or pelvis.

Reproductive: Uterus is anteverted. There is an apparent 2.1 x
cm mass arising from the posterolateral leftward aspect of the lower
uterine segment, a likely small leiomyoma. No other pelvic masses
evident.

Other: Appendix appears normal. There is no ascites or abscess in
the abdomen or pelvis.

Musculoskeletal: There is degenerative change in the lower thoracic
and lumbar spine regions. No blastic or lytic bone lesions are
evident. No intramuscular or abdominal wall lesions. Breast implants
are noted bilaterally.
IMPRESSION: No bowel wall thickening or bowel obstruction. No abscess. Appendix
appears normal.

Probable small leiomyoma arising from the leftward lower uterine
segment.

Fibrotic change in lung bases with mild bibasilar bronchiectatic
change.

Degenerative change in the lower thoracic and lumbar regions.

Foci of coronary artery calcification noted.
